# Patient Record
Sex: Male | Born: 2000 | Race: White | Hispanic: Yes | Marital: Single | Smoking: Current some day smoker
Health system: Southern US, Community
[De-identification: ages and names within clinical notes are randomized; demographics above are authoritative.]

## PROBLEM LIST (undated history)

## (undated) DIAGNOSIS — F419 Anxiety disorder, unspecified: Secondary | ICD-10-CM

## (undated) HISTORY — PX: TONSILLECTOMY: SUR1361

---
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
## 2021-06-23 IMAGING — CR DG WRIST COMPLETE 3+V*R*
4 series · 4 of 4 positions shown · non-contrast
Comparison: None.

CLINICAL DATA: Punched a wall, injury

EXAM:
RIGHT WRIST - COMPLETE 3+ VIEW

[wrist pa]
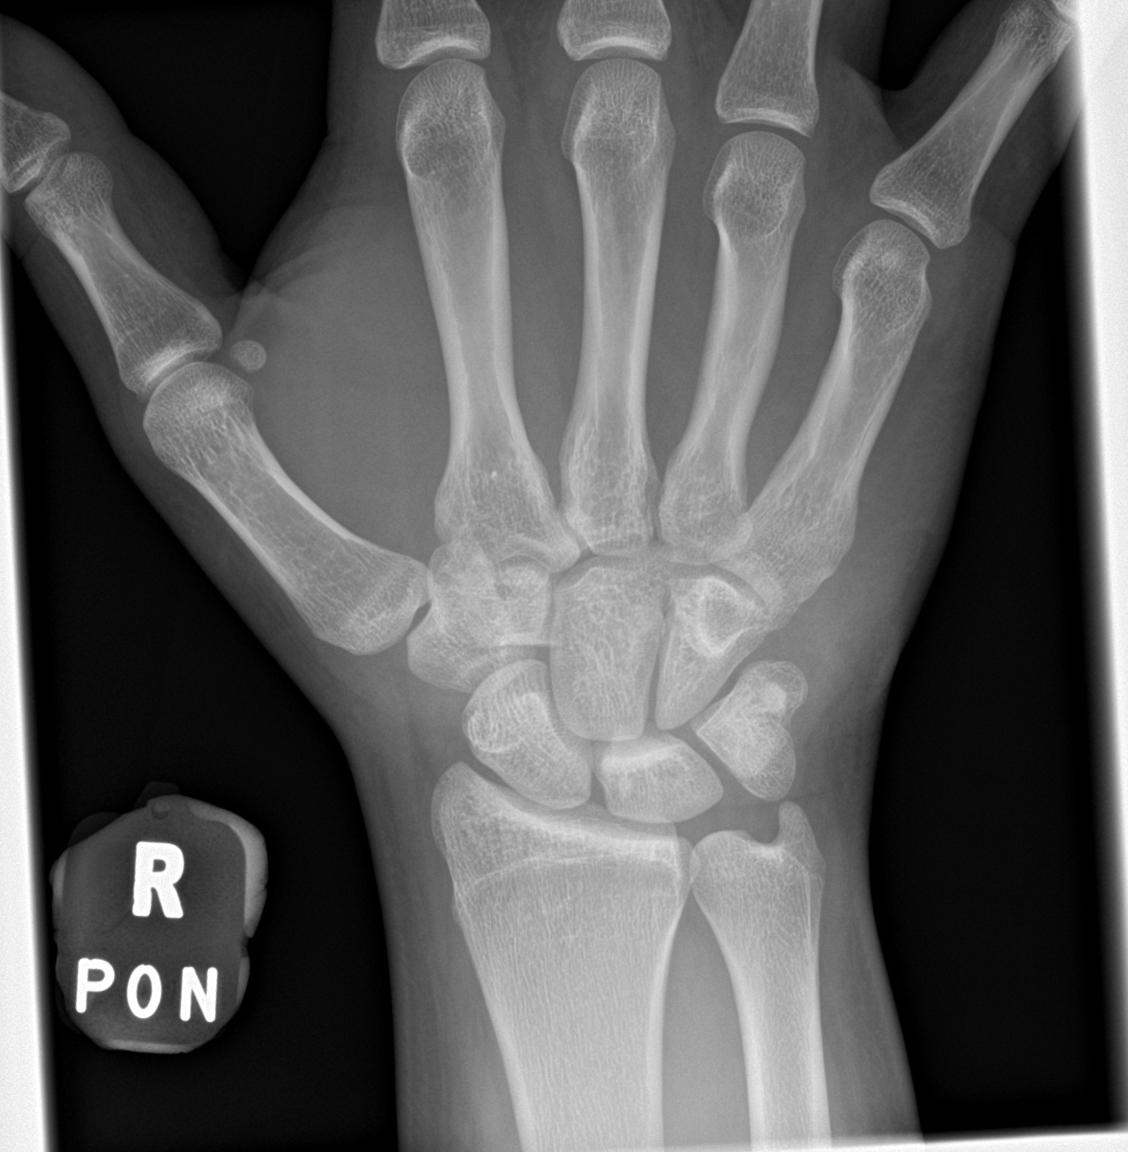

[wrist obl]
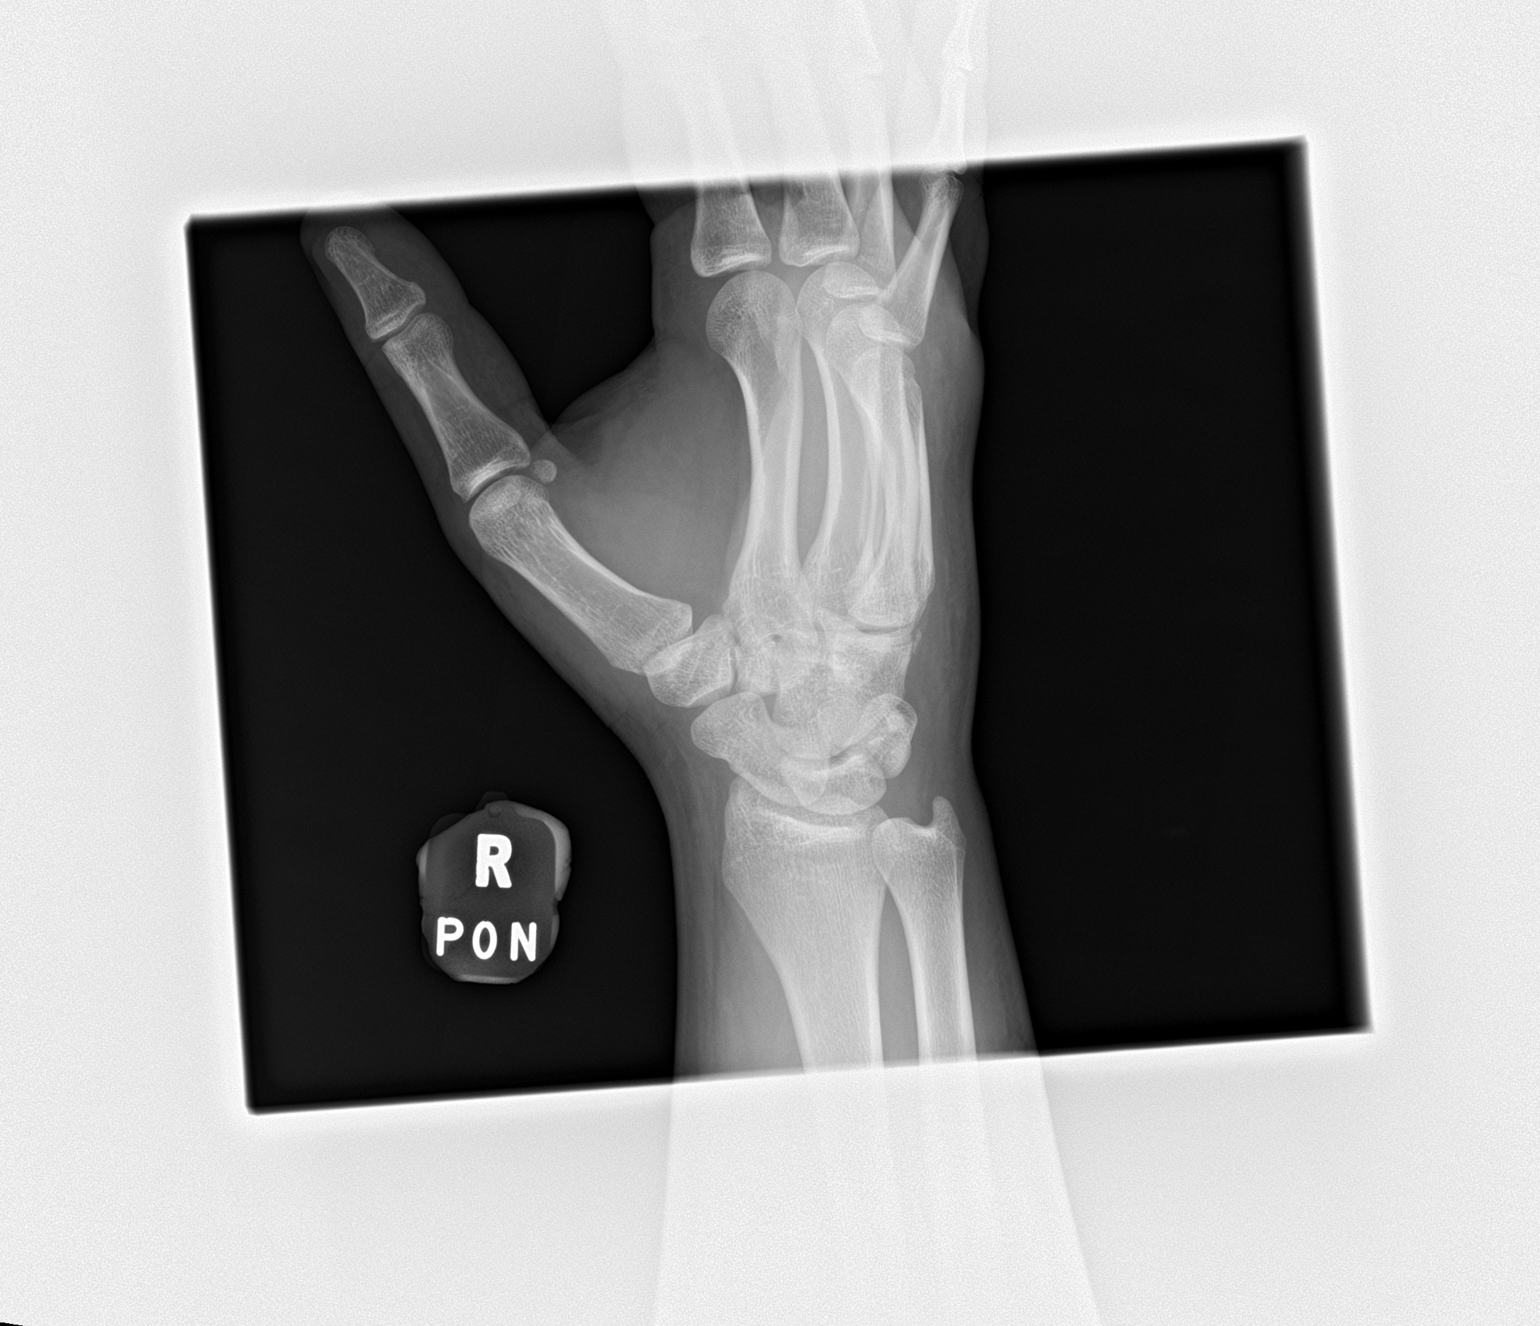

[wrist lat]
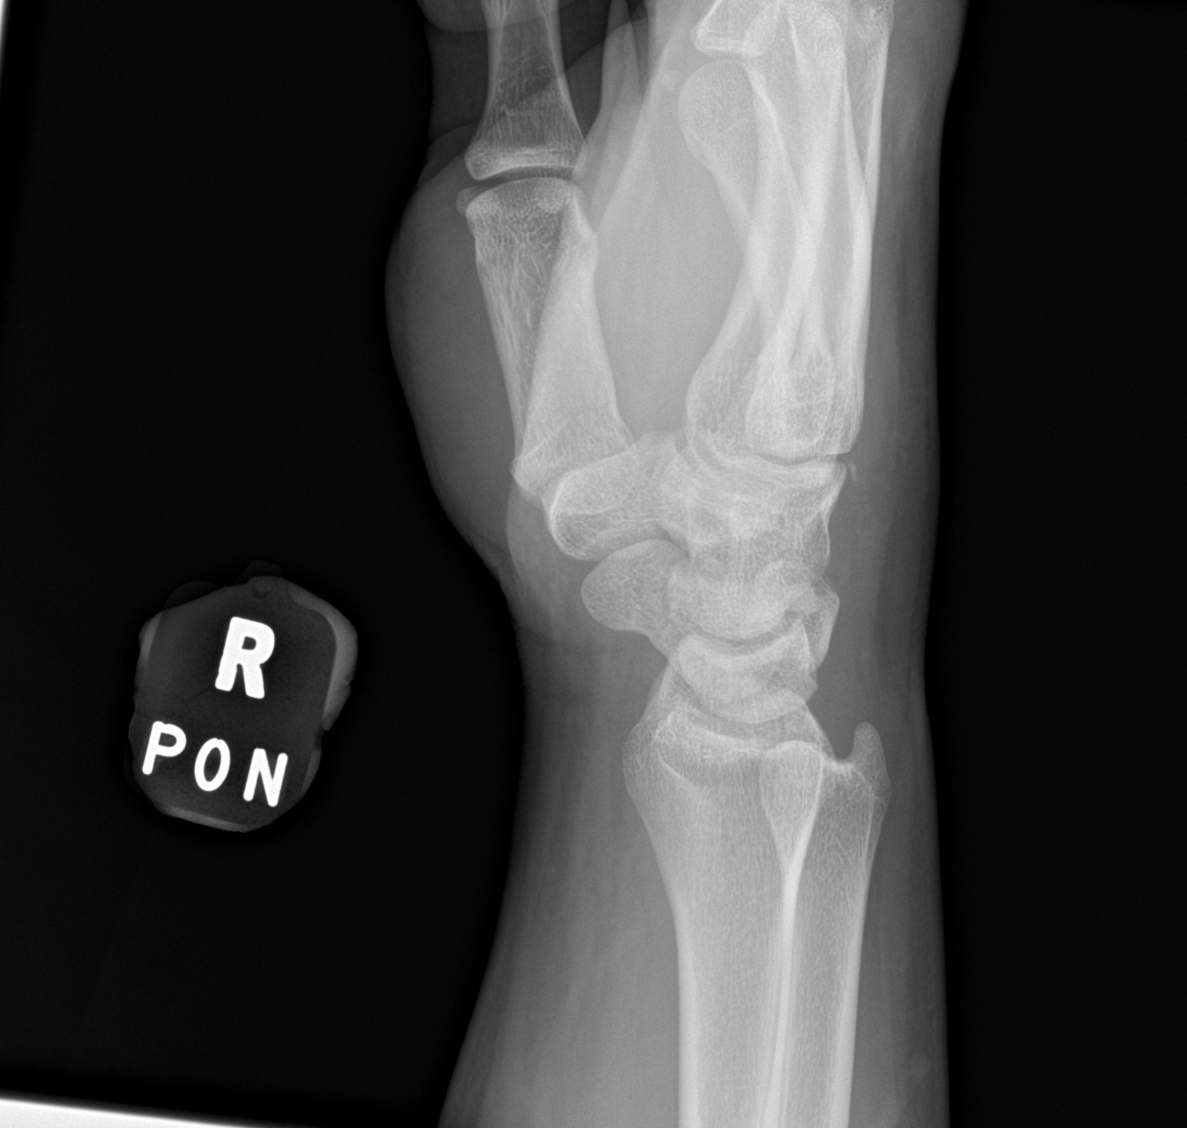

[wrist navicular]
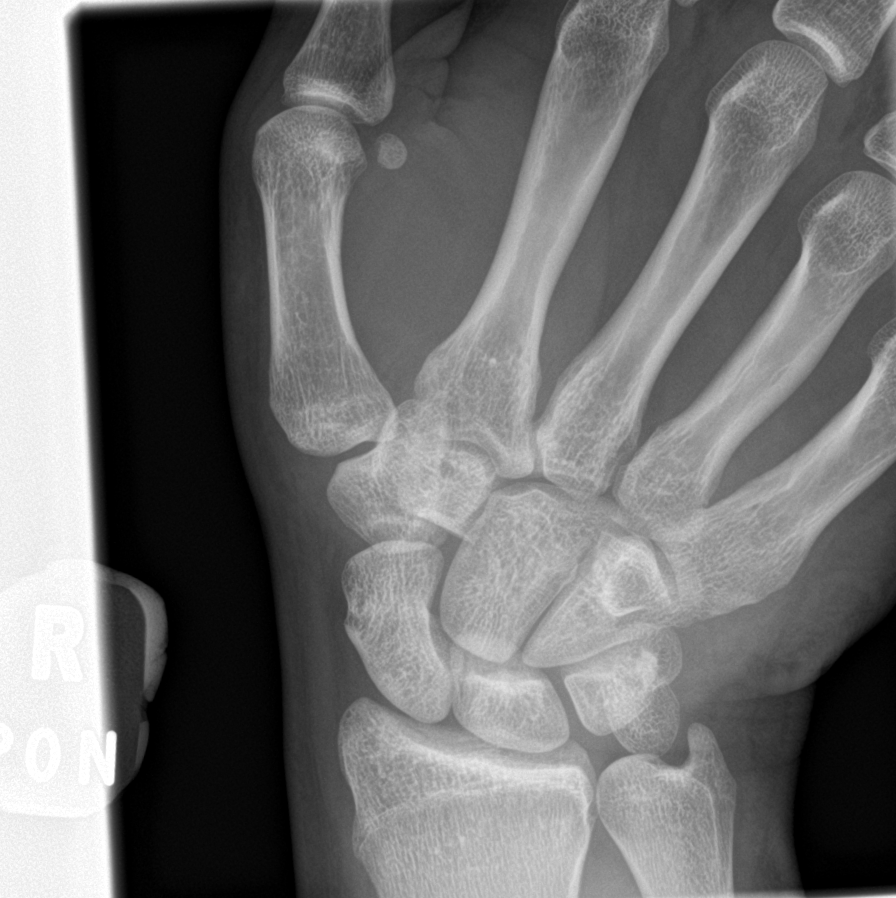

[4 of 4 positions shown; findings below may reference images not displayed]

FINDINGS: Tiny ossific fragment seen along the dorsal aspect of the distal
carpal row, likely from the hamate. No other displaced fracture
seen. There is a healed fifth metacarpal fracture. Dorsal soft
tissue swelling is seen.
IMPRESSION: Tiny chip fracture on the dorsal aspect of the hand, likely from the
hamate.
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
## 2022-07-26 DIAGNOSIS — R45851 Suicidal ideations: Secondary | ICD-10-CM | POA: Insufficient documentation

## 2022-07-26 DIAGNOSIS — Z79899 Other long term (current) drug therapy: Secondary | ICD-10-CM | POA: Insufficient documentation

## 2022-07-26 DIAGNOSIS — F17290 Nicotine dependence, other tobacco product, uncomplicated: Secondary | ICD-10-CM | POA: Insufficient documentation

## 2022-07-26 DIAGNOSIS — F339 Major depressive disorder, recurrent, unspecified: Secondary | ICD-10-CM | POA: Insufficient documentation

## 2022-07-26 DIAGNOSIS — Z91148 Patient's other noncompliance with medication regimen for other reason: Secondary | ICD-10-CM | POA: Insufficient documentation

## 2022-07-26 DIAGNOSIS — F17210 Nicotine dependence, cigarettes, uncomplicated: Secondary | ICD-10-CM | POA: Insufficient documentation

## 2022-07-26 DIAGNOSIS — Z1152 Encounter for screening for COVID-19: Secondary | ICD-10-CM | POA: Insufficient documentation

## 2022-07-26 NOTE — ED Triage Notes (Signed)
Pt presents to [facility] voluntarily, accompanied by his mother with complaint of depression, passive SI and overthinking. Pt stated ' I think too much" when asked what brought him to [facility]? Pt reports having dark thoughts and feeling that no one understands him. Pt reports an issue he had with a friend who he considered a support system. Pt reports that he shared his thoughts with her and he felt like she looks at him differently now and the relationship has changed. Pt states that he think about suicide often, but has never acted on his thoughts. Pt has history of depression and anxiety. Pt has past impatient psychiatric hospitalization at [facility]. Pt is not linked to outpatient services at this time nor is he taking prescribed medications. Pt currently denies HI, AVH.

## 2022-07-27 ENCOUNTER — Inpatient Hospital Stay (HOSPITAL_COMMUNITY)
Admission: AD | Admit: 2022-07-27 | Discharge: 2022-08-03 | DRG: 885 | Disposition: A | Discharge status: Home / Self Care | Payer: Medicaid Other | Source: Intra-hospital | Attending: Psychiatry | Admitting: Psychiatry

## 2022-07-27 ENCOUNTER — Encounter (HOSPITAL_COMMUNITY): Payer: Self-pay | Admitting: Psychiatry

## 2022-07-27 ENCOUNTER — Other Ambulatory Visit: Payer: Self-pay

## 2022-07-27 ENCOUNTER — Ambulatory Visit (HOSPITAL_COMMUNITY)
Admission: EM | Admit: 2022-07-27 | Discharge: 2022-07-27 | Disposition: A | Discharge status: Other Acute Inpatient Hospital | Payer: Medicaid Other | Attending: Psychiatry | Admitting: Psychiatry

## 2022-07-27 DIAGNOSIS — F17210 Nicotine dependence, cigarettes, uncomplicated: Secondary | ICD-10-CM | POA: Diagnosis present

## 2022-07-27 DIAGNOSIS — F411 Generalized anxiety disorder: Secondary | ICD-10-CM | POA: Diagnosis present

## 2022-07-27 DIAGNOSIS — F339 Major depressive disorder, recurrent, unspecified: Secondary | ICD-10-CM | POA: Diagnosis not present

## 2022-07-27 DIAGNOSIS — Z818 Family history of other mental and behavioral disorders: Secondary | ICD-10-CM | POA: Diagnosis not present

## 2022-07-27 DIAGNOSIS — R45851 Suicidal ideations: Secondary | ICD-10-CM

## 2022-07-27 DIAGNOSIS — Z79899 Other long term (current) drug therapy: Secondary | ICD-10-CM | POA: Diagnosis not present

## 2022-07-27 DIAGNOSIS — F819 Developmental disorder of scholastic skills, unspecified: Secondary | ICD-10-CM | POA: Diagnosis present

## 2022-07-27 DIAGNOSIS — F333 Major depressive disorder, recurrent, severe with psychotic symptoms: Principal | ICD-10-CM | POA: Diagnosis present

## 2022-07-27 DIAGNOSIS — F419 Anxiety disorder, unspecified: Secondary | ICD-10-CM | POA: Diagnosis present

## 2022-07-27 DIAGNOSIS — Z1152 Encounter for screening for COVID-19: Secondary | ICD-10-CM

## 2022-07-27 DIAGNOSIS — F259 Schizoaffective disorder, unspecified: Secondary | ICD-10-CM | POA: Diagnosis not present

## 2022-07-27 DIAGNOSIS — Z560 Unemployment, unspecified: Secondary | ICD-10-CM

## 2022-07-27 DIAGNOSIS — Z91199 Patient's noncompliance with other medical treatment and regimen due to unspecified reason: Secondary | ICD-10-CM

## 2022-07-27 DIAGNOSIS — Z91148 Patient's other noncompliance with medication regimen for other reason: Secondary | ICD-10-CM | POA: Diagnosis not present

## 2022-07-27 DIAGNOSIS — R682 Dry mouth, unspecified: Secondary | ICD-10-CM | POA: Diagnosis present

## 2022-07-27 DIAGNOSIS — F251 Schizoaffective disorder, depressive type: Principal | ICD-10-CM | POA: Diagnosis present

## 2022-07-27 DIAGNOSIS — F1910 Other psychoactive substance abuse, uncomplicated: Secondary | ICD-10-CM

## 2022-07-27 DIAGNOSIS — F1220 Cannabis dependence, uncomplicated: Secondary | ICD-10-CM | POA: Diagnosis present

## 2022-07-27 LAB — POCT URINE DRUG SCREEN - MANUAL ENTRY (I-SCREEN)
POC Amphetamine UR: NOT DETECTED
POC Buprenorphine (BUP): NOT DETECTED
POC Cocaine UR: NOT DETECTED
POC Marijuana UR: POSITIVE — AB
POC Methadone UR: NOT DETECTED
POC Methamphetamine UR: NOT DETECTED
POC Morphine: NOT DETECTED
POC Oxazepam (BZO): NOT DETECTED
POC Oxycodone UR: NOT DETECTED
POC Secobarbital (BAR): NOT DETECTED

## 2022-07-27 LAB — RESP PANEL BY RT-PCR (RSV, FLU A&B, COVID)  RVPGX2
Influenza A by PCR: NEGATIVE
Influenza B by PCR: NEGATIVE
Resp Syncytial Virus by PCR: NEGATIVE
SARS Coronavirus 2 by RT PCR: NEGATIVE

## 2022-07-27 LAB — CBC WITH DIFFERENTIAL/PLATELET
Abs Immature Granulocytes: 0.01 10*3/uL (ref 0.00–0.07)
Basophils Absolute: 0 10*3/uL (ref 0.0–0.1)
Basophils Relative: 1 %
Eosinophils Absolute: 0.1 10*3/uL (ref 0.0–0.5)
Eosinophils Relative: 2 %
HCT: 48.4 % (ref 39.0–52.0)
Hemoglobin: 16.6 g/dL (ref 13.0–17.0)
Immature Granulocytes: 0 %
Lymphocytes Relative: 17 %
Lymphs Abs: 1.1 10*3/uL (ref 0.7–4.0)
MCH: 31.6 pg (ref 26.0–34.0)
MCHC: 34.3 g/dL (ref 30.0–36.0)
MCV: 92.2 fL (ref 80.0–100.0)
Monocytes Absolute: 0.6 10*3/uL (ref 0.1–1.0)
Monocytes Relative: 9 %
Neutro Abs: 4.7 10*3/uL (ref 1.7–7.7)
Neutrophils Relative %: 71 %
Platelets: 201 10*3/uL (ref 150–400)
RBC: 5.25 MIL/uL (ref 4.22–5.81)
RDW: 12.7 % (ref 11.5–15.5)
WBC: 6.6 10*3/uL (ref 4.0–10.5)
nRBC: 0 % (ref 0.0–0.2)

## 2022-07-27 LAB — COMPREHENSIVE METABOLIC PANEL
ALT: 14 U/L (ref 0–44)
AST: 17 U/L (ref 15–41)
Albumin: 4.3 g/dL (ref 3.5–5.0)
Alkaline Phosphatase: 59 U/L (ref 38–126)
Anion gap: 10 (ref 5–15)
BUN: 7 mg/dL (ref 6–20)
CO2: 25 mmol/L (ref 22–32)
Calcium: 9.3 mg/dL (ref 8.9–10.3)
Chloride: 103 mmol/L (ref 98–111)
Creatinine, Ser: 0.94 mg/dL (ref 0.61–1.24)
GFR, Estimated: >60 mL/min (ref >60)
Glucose, Bld: 131 mg/dL — ABNORMAL HIGH (ref 70–99)
Potassium: 3 mmol/L — ABNORMAL LOW (ref 3.5–5.1)
Sodium: 138 mmol/L (ref 135–145)
Total Bilirubin: 0.5 mg/dL (ref 0.3–1.2)
Total Protein: 6.8 g/dL (ref 6.5–8.1)

## 2022-07-27 LAB — LIPID PANEL
Cholesterol: 155 mg/dL (ref 0–200)
HDL: 41 mg/dL (ref >40)
LDL Cholesterol: 96 mg/dL (ref 0–99)
Total CHOL/HDL Ratio: 3.8 RATIO
Triglycerides: 91 mg/dL (ref <150)
VLDL: 18 mg/dL (ref 0–40)

## 2022-07-27 LAB — TSH: TSH: 1.104 u[IU]/mL (ref 0.350–4.500)

## 2022-07-27 LAB — POC SARS CORONAVIRUS 2 AG: SARSCOV2ONAVIRUS 2 AG: NEGATIVE

## 2022-07-27 MED ORDER — DIPHENHYDRAMINE HCL 50 MG/ML IJ SOLN: 50.0000 mg | Freq: Three times a day (TID) | INTRAMUSCULAR | Status: DC | PRN

## 2022-07-27 MED ORDER — DIPHENHYDRAMINE HCL 25 MG PO CAPS: 50.0000 mg | ORAL_CAPSULE | Freq: Three times a day (TID) | ORAL | Status: DC | PRN

## 2022-07-27 MED ORDER — ACETAMINOPHEN 325 MG PO TABS
650.0000 mg | ORAL_TABLET | Freq: Four times a day (QID) | ORAL | Status: DC | PRN
  Administered 2022-07-30: 650 mg via ORAL
  Filled 2022-07-27: qty 2

## 2022-07-27 MED ORDER — OLANZAPINE 5 MG PO TBDP: 5.0000 mg | ORAL_TABLET | Freq: Three times a day (TID) | ORAL | Status: DC | PRN

## 2022-07-27 MED ORDER — MAGNESIUM HYDROXIDE 400 MG/5ML PO SUSP: 30.0000 mL | Freq: Every day | ORAL | Status: DC | PRN

## 2022-07-27 MED ORDER — ALUM & MAG HYDROXIDE-SIMETH 200-200-20 MG/5ML PO SUSP: 30.0000 mL | ORAL | 0 refills | Status: DC | PRN

## 2022-07-27 MED ORDER — ACETAMINOPHEN 325 MG PO TABS: 650.0000 mg | ORAL_TABLET | Freq: Four times a day (QID) | ORAL | Status: DC | PRN

## 2022-07-27 MED ORDER — ZIPRASIDONE MESYLATE 20 MG IM SOLR: 20.0000 mg | INTRAMUSCULAR | Status: DC | PRN

## 2022-07-27 MED ORDER — HALOPERIDOL LACTATE 5 MG/ML IJ SOLN: 5.0000 mg | Freq: Three times a day (TID) | INTRAMUSCULAR | Status: DC | PRN

## 2022-07-27 MED ORDER — MAGNESIUM HYDROXIDE 400 MG/5ML PO SUSP: 30.0000 mL | Freq: Every day | ORAL | 0 refills | Status: DC | PRN

## 2022-07-27 MED ORDER — LORAZEPAM 1 MG PO TABS: 1.0000 mg | ORAL_TABLET | ORAL | Status: DC | PRN

## 2022-07-27 MED ORDER — LORAZEPAM 2 MG/ML IJ SOLN: 2.0000 mg | Freq: Three times a day (TID) | INTRAMUSCULAR | Status: DC | PRN

## 2022-07-27 MED ORDER — TRAZODONE HCL 50 MG PO TABS
50.0000 mg | ORAL_TABLET | Freq: Every evening | ORAL | Status: DC | PRN
  Administered 2022-07-27: 50 mg via ORAL
  Filled 2022-07-27: qty 1

## 2022-07-27 MED ORDER — ESCITALOPRAM OXALATE 10 MG PO TABS
20.0000 mg | ORAL_TABLET | Freq: Every day | ORAL | Status: DC
  Administered 2022-07-27: 20 mg via ORAL
  Filled 2022-07-27: qty 2

## 2022-07-27 MED ORDER — ALUM & MAG HYDROXIDE-SIMETH 200-200-20 MG/5ML PO SUSP: 30.0000 mL | ORAL | Status: DC | PRN

## 2022-07-27 MED ORDER — NICOTINE 14 MG/24HR TD PT24
14.0000 mg | MEDICATED_PATCH | Freq: Every day | TRANSDERMAL | Status: DC
  Administered 2022-07-28 – 2022-07-30 (×3): 14 mg via TRANSDERMAL
  Filled 2022-07-27 (×7): qty 1

## 2022-07-27 MED ORDER — HALOPERIDOL 5 MG PO TABS: 5.0000 mg | ORAL_TABLET | Freq: Three times a day (TID) | ORAL | Status: DC | PRN

## 2022-07-27 MED ORDER — LORAZEPAM 1 MG PO TABS: 1.0000 mg | ORAL_TABLET | ORAL | 0 refills | Status: DC | PRN

## 2022-07-27 MED ORDER — LORAZEPAM 1 MG PO TABS: 2.0000 mg | ORAL_TABLET | Freq: Three times a day (TID) | ORAL | Status: DC | PRN

## 2022-07-27 MED ORDER — HYDROXYZINE HCL 25 MG PO TABS
25.0000 mg | ORAL_TABLET | Freq: Four times a day (QID) | ORAL | Status: DC | PRN
  Administered 2022-07-28 – 2022-07-30 (×2): 25 mg via ORAL
  Filled 2022-07-27 (×5): qty 1

## 2022-07-27 NOTE — ED Notes (Signed)
[facility]SPORT CALLED 

## 2022-07-27 NOTE — ED Notes (Signed)
REPORT GIVEN TO LIZ RN@[facility] ADULT UNIT  

## 2022-07-27 NOTE — BH Assessment (Signed)
Per Larose Kells, RN pt has been accepted to [facility] [facility] pending discharges.    Vertell Novak, [place], [facility], [facility] Triage Specialist [phone]
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
## 2022-07-27 NOTE — ED Notes (Signed)
Patient is awake and alert on unit.  Eating breakfast.  No distress.

## 2022-07-27 NOTE — Progress Notes (Signed)
   07/27/22 2229  Psych Admission Type (Psych Patients Only)  Admission Status Voluntary  Psychosocial Assessment  Patient Complaints Depression  Eye Contact Brief  Facial Expression Flat  Affect Appropriate to circumstance  Speech Logical/coherent  Interaction Assertive  Motor Activity Other (Comment) (WDL)  Appearance/Hygiene Unremarkable  Behavior Characteristics Guarded;Fidgety  Mood Pleasant  Thought Process  Coherency WDL  Content WDL  Delusions None reported or observed  Perception WDL  Hallucination None reported or observed  Judgment Impaired  Confusion None  Danger to Self  Current suicidal ideation? Denies  Self-Injurious Behavior No self-injurious ideation or behavior indicators observed or expressed   Agreement Not to Harm Self Yes  Description of Agreement verbal  Danger to Others  Danger to Others None reported or observed

## 2022-07-27 NOTE — ED Notes (Signed)
Pt sitting in middle of floor coloring and doing puzzles. Pt calm and cooperative. No c/o pain or distress. Will continue to monitor for safety
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
## 2022-07-27 NOTE — Progress Notes (Signed)
Pt was accepted to [facility] 07/27/2022. Bed assignment: 99-2  Pt meets inpatient criteria per Rosezetta Schlatter, MD  Attending Physician will be Nadir Winfred Leeds, MD  Report can be called to: - Adult unit: [phone]  Pt can arrive after 9:30 PM  Care Team Notified: [facility] Antionette Lieutenant Diego, RN, and Rosezetta Schlatter, MD  [place], [place]  07/27/2022 12:05 PM
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
## 2022-07-27 NOTE — Discharge Instructions (Signed)
Follow-up recommendations:  Activity:  Normal, as tolerated Diet:  Per PCP recommendation  Patient is instructed prior to discharge to: Take all medications as prescribed by his mental healthcare provider. Report any adverse effects and/or reactions from the medicines to his outpatient provider promptly. Patient has been instructed & cautioned: To not engage in alcohol and or illegal drug use while on prescription medicines.  In the event of worsening symptoms, patient is instructed to call the crisis hotline at 988, 911 and or go to the nearest ED for appropriate evaluation and treatment of symptoms. To follow-up with his primary care provider for your other medical issues, concerns and or health care needs.  

## 2022-07-27 NOTE — ED Provider Notes (Signed)
FBC/OBS ASAP Discharge Summary  Date and Time: 07/27/2022 10:22 AM  Name: Ryan Horne  MRN:  [id number]   Discharge Diagnoses:  Final diagnoses:  Substance abuse ([place])  Recurrent major depressive disorder, remission status unspecified ([place])  Suicidal ideation    Subjective: Ryan Horne is a 22y/o male with a history of suicidal ideation, unspecified learning disorder, self injury behaviors, depression who presented to [facility] voluntarily accompanied by his mother for a "mental crisis I guess," being a danger to himself.   Stay Summary: Patient was admitted to the OBS unit where he was resumed on home medication Lexapro; patient had been noncompliant with Abilify previously prescribed, so this was not restarted at the time. On AM reassessment, patient denied SI, HI, and AVH, but continued to report that he felt he was a danger to himself. He was not very forthcoming with information. Patient did not appear to respond to internal stimuli nor did he appear psychotic. Patient continues to be recommended for inpatient admission due to feeling a danger to himself, medication optimization, and safety planning.   Total Time spent with patient: 30 minutes  Past Psychiatric History:  Major depressive disorder, self injury behavior, suicidal ideation, learning disorder. Prior inpatient at [facility] in 2020.  Past Medical History: See chart   Family History: Unknown   Social History: Polysubstance use; also smokes 7 cigarettes per day and vapes nicotine and mariuana. Tobacco Cessation:  A prescription for an FDA-approved tobacco cessation medication provided at discharge  Current Medications:  Current Facility-Administered Medications  Medication Dose Route Frequency Provider Last Rate Last Admin   acetaminophen (TYLENOL) tablet 650 mg  650 mg Oral Q6H PRN Evette Georges, NP       alum & mag hydroxide-simeth (MAALOX/MYLANTA) 200-200-20 MG/5ML suspension 30 mL  30 mL Oral Q4H PRN Evette Georges, NP        escitalopram (LEXAPRO) tablet 20 mg  20 mg Oral Daily Evette Georges, NP   20 mg at 07/27/22 0925   OLANZapine zydis (ZYPREXA) disintegrating tablet 5 mg  5 mg Oral Q8H PRN Evette Georges, NP       And   LORazepam (ATIVAN) tablet 1 mg  1 mg Oral PRN Evette Georges, NP       And   ziprasidone (GEODON) injection 20 mg  20 mg Intramuscular PRN Evette Georges, NP       magnesium hydroxide (MILK OF MAGNESIA) suspension 30 mL  30 mL Oral Daily PRN Evette Georges, NP       Current Outpatient Medications  Medication Sig Dispense Refill   escitalopram (LEXAPRO) 20 MG tablet Take 1 tablet (20 mg total) by mouth daily. (Patient not taking: Reported on 07/27/2022) 30 tablet 1    PTA Medications:  Facility Ordered Medications  Medication   acetaminophen (TYLENOL) tablet 650 mg   alum & mag hydroxide-simeth (MAALOX/MYLANTA) 200-200-20 MG/5ML suspension 30 mL   magnesium hydroxide (MILK OF MAGNESIA) suspension 30 mL   OLANZapine zydis (ZYPREXA) disintegrating tablet 5 mg   And   LORazepam (ATIVAN) tablet 1 mg   And   ziprasidone (GEODON) injection 20 mg   escitalopram (LEXAPRO) tablet 20 mg   PTA Medications  Medication Sig   escitalopram (LEXAPRO) 20 MG tablet Take 1 tablet (20 mg total) by mouth daily. (Patient not taking: Reported on 07/27/2022)       02/23/2020    3:34 PM  Depression screen PHQ 2/9  Decreased Interest 3  Down, Depressed, Hopeless 3  PHQ - 2  Score 6  Altered sleeping 3  Tired, decreased energy 1  Change in appetite 2  Feeling bad or failure about yourself  3  Trouble concentrating 2  Moving slowly or fidgety/restless 2  Suicidal thoughts 0  PHQ-9 Score 19  Difficult doing work/chores Somewhat difficult    Flowsheet Row ED from 07/27/2022 in [facility] Admission (Discharged) from 11/22/2018 in [place] ED from 11/21/2018 in [facility] [place] No Risk  Moderate Risk Low Risk       Musculoskeletal  Strength & Muscle Tone: within normal limits Gait & Station: normal Patient leans: N/A  Psychiatric Specialty Exam  Presentation  General Appearance:  Casual  Eye Contact: Good  Speech: Clear and Coherent  Speech Volume: Normal  Handedness: Right   Mood and Affect  Mood: Anxious; Depressed; Hopeless; Worthless; Labile  Affect: Engineer, building services Processes: Linear  Descriptions of Associations:Intact  Orientation:Full (Time, Place and Person)  Thought Content:WDL  Diagnosis of Schizophrenia or Schizoaffective disorder in past: No  Duration of Psychotic Symptoms: Greater than six months   Hallucinations:Hallucinations: None  Ideas of Reference:None  Suicidal Thoughts:Denies  Homicidal Thoughts:Homicidal Thoughts: No   Sensorium  Memory: Immediate Fair  Judgment: Poor  Insight: Fair; shallow   Executive Functions  Concentration: Fair  Attention Span: Good  Recall: Good  Fund of Knowledge: Good  Language: Good   Psychomotor Activity  Psychomotor Activity: Psychomotor Activity: Normal   Assets  Assets: Desire for Improvement; Resilience; Social Support; Vocational/Educational   Sleep  Sleep: Sleep: Poor Number of Hours of Sleep: 3   Nutritional Assessment (For OBS and FBC admissions only) Has the patient had a weight loss or gain of 10 pounds or more in the last 3 months?: No Has the patient had a decrease in food intake/or appetite?: No Does the patient have dental problems?: No Does the patient have eating habits or behaviors that may be indicators of an eating disorder including binging or inducing vomiting?: No Has the patient recently lost weight without trying?: 0 Has the patient been eating poorly because of a decreased appetite?: 0 Malnutrition Screening Tool Score: 0    Physical Exam  Physical Exam Vitals reviewed.  HENT:     Head:  Normocephalic and atraumatic.  Pulmonary:     Effort: Pulmonary effort is normal.  Neurological:     General: No focal deficit present.     Mental Status: He is alert and oriented to person, place, and time.     Motor: No weakness.     Gait: Gait normal.   Review of Systems  Constitutional:  Negative for malaise/fatigue.  Neurological:  Negative for headaches.  Psychiatric/Behavioral:  Negative for hallucinations and suicidal ideas.    Blood pressure (!) 137/98, pulse 97, temperature 98 F (36.7 C), temperature source Oral, resp. rate 18, SpO2 99 %. There is no height or weight on file to calculate BMI.  Demographic Factors:  Male and Adolescent or young adult  Loss Factors: NA  Historical Factors: Prior suicide attempts and Impulsivity  Risk Reduction Factors:   Sense of responsibility to family, Living with another person, especially a relative, and Positive social support  Continued Clinical Symptoms:  Depression:   Impulsivity Alcohol/Substance Abuse/Dependencies Unstable or Poor Therapeutic Relationship Previous Psychiatric Diagnoses and Treatments  Cognitive Features That Contribute To Risk:  None    Suicide Risk:  Severe:  Frequent,  intense, and enduring suicidal ideation, specific plan, no subjective intent, but some objective markers of intent (i.e., choice of lethal method), the method is accessible, some limited preparatory behavior, evidence of impaired self-control, severe dysphoria/symptomatology, multiple risk factors present, and few if any protective factors, particularly a lack of social support.  Plan Of Care/Follow-up recommendations:  Follow-up recommendations:  Activity:  Normal, as tolerated Diet:  Per PCP recommendation  Patient is instructed prior to discharge to: Take all medications as prescribed by his mental healthcare provider. Report any adverse effects and/or reactions from the medicines to his outpatient provider promptly. Patient has  been instructed & cautioned: To not engage in alcohol and or illegal drug use while on prescription medicines.  In the event of worsening symptoms, patient is instructed to call the crisis hotline at 988, 911 and or go to the nearest ED for appropriate evaluation and treatment of symptoms. To follow-up with his primary care provider for your other medical issues, concerns and or health care needs.   Disposition: [facility], 402-2  Rosezetta Schlatter, MD 07/27/2022, 10:22 AM

## 2022-07-27 NOTE — ED Notes (Signed)
Pt admitted to OBS endorsing thinking a lot these days and that makes him freak out. Pt was accompanied by mother. Patient denies SI currently, Patient was cooperative during the admission assessment. Skin assessment complete. Belongings inventoried. Patient oriented to unit and unit rules. Meal and drinks offered to patient.  Patient verbalized agreement to treatment plans. Patient verbally contracts for safety while hospitalized. Will monitor for safety.

## 2022-07-28 DIAGNOSIS — F1220 Cannabis dependence, uncomplicated: Secondary | ICD-10-CM | POA: Diagnosis present

## 2022-07-28 DIAGNOSIS — R45851 Suicidal ideations: Secondary | ICD-10-CM

## 2022-07-28 LAB — HEMOGLOBIN A1C
Hgb A1c MFr Bld: 5.1 % (ref 4.8–5.6)
Mean Plasma Glucose: 100 mg/dL

## 2022-07-28 MED ORDER — ARIPIPRAZOLE 5 MG PO TABS
5.0000 mg | ORAL_TABLET | Freq: Every day | ORAL | Status: DC
  Administered 2022-07-28 – 2022-07-30 (×3): 5 mg via ORAL
  Filled 2022-07-28 (×3): qty 1

## 2022-07-28 MED ORDER — POTASSIUM CHLORIDE CRYS ER 20 MEQ PO TBCR
40.0000 meq | EXTENDED_RELEASE_TABLET | Freq: Two times a day (BID) | ORAL | Status: AC
  Administered 2022-07-28 (×2): 40 meq via ORAL
  Filled 2022-07-28 (×4): qty 2

## 2022-07-28 MED ORDER — TRAZODONE HCL 50 MG PO TABS
50.0000 mg | ORAL_TABLET | Freq: Every day | ORAL | Status: DC
  Administered 2022-07-28: 50 mg via ORAL
  Filled 2022-07-28 (×4): qty 1

## 2022-07-28 NOTE — Progress Notes (Signed)
   07/28/22 2132  Psych Admission Type (Psych Patients Only)  Admission Status Voluntary  Psychosocial Assessment  Patient Complaints Depression  Eye Contact Fair  Facial Expression Flat  Affect Appropriate to circumstance  Speech Logical/coherent  Interaction Minimal  Motor Activity Other (Comment) (wnl)  Appearance/Hygiene Unremarkable  Behavior Characteristics Cooperative;Appropriate to situation  Mood Pleasant  Thought Process  Coherency WDL  Content WDL  Delusions None reported or observed  Perception WDL  Hallucination None reported or observed  Judgment Poor  Confusion None  Danger to Self  Current suicidal ideation? Denies  Danger to Others  Danger to Others None reported or observed

## 2022-07-28 NOTE — Group Note (Signed)
[place] LCSW Group Therapy Note   Group Date: 07/28/2022 Start Time: 1300 End Time: 1400  Type of Therapy and Topic:  Group Therapy:  Feelings around Relapse and Recovery  Participation Level:  Did Not Attend   Mood:  Description of Group:    Patients in this group will discuss emotions they experience before and after a relapse. They will process how experiencing these feelings, or avoidance of experiencing them, relates to having a relapse. Facilitator will guide patients to explore emotions they have related to recovery. Patients will be encouraged to process which emotions are more powerful. They will be guided to discuss the emotional reaction significant others in their lives may have to patients' relapse or recovery. Patients will be assisted in exploring ways to respond to the emotions of others without this contributing to a relapse.  Therapeutic Goals: Patient will identify two or more emotions that lead to relapse for them:  Patient will identify two emotions that result when they relapse:  Patient will identify two emotions related to recovery:  Patient will demonstrate ability to communicate their needs through discussion and/or role plays.   Summary of Patient Progress: Patient did not attend group despite encouraged participation.     Therapeutic Modalities:   Cognitive Behavioral Therapy Solution-Focused Therapy Assertiveness Training Relapse Prevention Therapy   Durenda Hurt, [place]

## 2022-07-28 NOTE — Progress Notes (Addendum)
D. Pt presented anxious, somewhat religiously preoccupied and a little disorganized upon initial approach this am. Throughout the shift, pt has been calm and cooperative, visible in the milieu, sitting in the dayroom writing with minimal interactions with peers. Per pt's self inventory, pt rated his depression,hopelessness and anxiety a 6/5/6, respectively.   Pt currently denies SI/HI and AVH and agrees to contact staff before acting on any harmful thoughts. Pt denied paranoia, but did appear distressed when discussing his new religious beliefs, stating, "there's a religious warfare going on."  A. Labs and vitals monitored. Pt given and educated on medications. Pt supported emotionally and encouraged to express concerns and ask questions.   R. Pt remains safe with 15 minute checks. Will continue POC.    07/28/22 1100  Psych Admission Type (Psych Patients Only)  Admission Status Voluntary  Psychosocial Assessment  Patient Complaints Anxiety;Depression  Eye Contact Fair  Facial Expression Anxious  Affect Appropriate to circumstance  Speech Logical/coherent  Interaction Assertive  Motor Activity Other (Comment) (level 3 observation)  Appearance/Hygiene Unremarkable  Behavior Characteristics Cooperative;Anxious  Mood Pleasant  Thought Process  Coherency Tangential;WDL  Content Religiosity  Delusions Religious  Perception WDL  Hallucination None reported or observed  Judgment Impaired  Confusion None  Danger to Self  Current suicidal ideation? Denies  Self-Injurious Behavior No self-injurious ideation or behavior indicators observed or expressed   Agreement Not to Harm Self Yes  Description of Agreement verbal contract  Danger to Others  Danger to Others None reported or observed
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
## 2022-07-28 NOTE — H&P (Cosign Needed Addendum)
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 BMP in the morning.  CBC reviewed.UDS positive for THC  Labs ordered: BMP, UA, baseline EKG ordered.    PLAN Safety and Monitoring: Voluntary admission to inpatient psychiatric unit for safety, stabilization and treatment Daily contact with patient to assess and evaluate symptoms and progress in treatment Patient's case to be discussed in multi-disciplinary team meeting Observation Level : q15 minute checks Vital signs: q12  hours Precautions: Safety  Long Term Goal(s): Improvement in symptoms so as ready for discharge  Short Term Goals: Ability to identify changes in lifestyle to reduce recurrence of condition will improve, Ability to verbalize feelings will improve, Ability to disclose and discuss suicidal ideas, Ability to identify and develop effective coping behaviors will improve, Ability to maintain clinical measurements within normal limits will improve, Compliance with prescribed medications will improve, and Ability to identify triggers associated with substance abuse/mental health issues will improve  Diagnoses Active Problems:   MDD (major depressive disorder), recurrent, severe, with psychosis (HCC)   Anxiety   Delta-9-tetrahydrocannabinol (THC) dependence (HCC)  Medications  -Start Abilify 5 mg daily for mood stabilization and psychosis -We will consider adding Prozac tomorrow 3/10 after talking to mother to determine if patient has periods  of mania -Change trazodone to 50 mg nightly for sleep -Continue nicotine patch daily for nicotine addiction -Continue hydroxyzine 25 mg 3 times daily as needed for anxiety -Continue agitation protocol: Ativan/Haldol/Benadryl-please see MAR for complete order  ANTIPSYCHOTIC CONSENT : We discussed the risks, benefits, side effects, and alternatives to THE PRESCRIBED ANTIPSYCHOTIC,  including but not limited to, the risk of fatigue, sedation, metabolic syndrome, weight gain, movement abnormalities such as tremor & cogwheeling & tardive dyskinesia, temperature sensitivity, photosensitivity, blood pressure changes, heart rhythm effects, potential for medication interactions, and to not take these medications with alcohol or illicit drugs; informed consent was obtained. We discussed the necessity for routine monitoring including rating scales of abnormal movements, blood work, and ekgs, while the patient is prescribed antipsychotic medication.   Other PRNS -Continue  Tylenol 650 mg every 6 hours PRN for mild pain -Continue Maalox 30 mg every 4 hrs PRN for indigestion -Continue Imodium 2-4 mg as needed for diarrhea -Continue Milk of Magnesia as needed every 6 hrs for constipation -Continue Zofran disintegrating tabs every 6 hrs PRN for nausea  Discharge Planning: Social work and case management to assist with discharge planning and identification of hospital follow-up needs prior to discharge Estimated LOS: 5-7 days Discharge Concerns: Need to establish a safety plan; Medication compliance and effectiveness Discharge Goals: Return home with outpatient referrals for mental health follow-up including medication management/psychotherapy  I certify that inpatient services furnished can reasonably be expected to improve the patient's condition.    Nicholes Rough, [place] 3/9/20246:24 PM

## 2022-07-28 NOTE — [facility] Suicide Risk Assessment (Signed)
[facility] INPATIENT:  Family/Significant Other Suicide Prevention Education  Suicide Prevention Education:  Education Completed; Ted Bargeron, mother, [phone]  (name of family member/significant other) has been identified by the patient as the family member/significant other with whom the patient will be residing, and identified as the person(s) who will aid the patient in the event of a mental health crisis (suicidal ideations/suicide attempt).  With written consent from the patient, the family member/significant other has been provided the following suicide prevention education, prior to the and/or following the discharge of the patient.  The suicide prevention education provided includes the following: Suicide risk factors Suicide prevention and interventions National Suicide Hotline telephone number [facility] assessment telephone number [facility] Emergency Assistance [place] and/or Residential Mobile Crisis Unit telephone number  Request made of family/significant other to: Remove weapons (e.g., guns, rifles, knives), all items previously/currently identified as safety concern.   Remove drugs/medications (over-the-counter, prescriptions, illicit drugs), all items previously/currently identified as a safety concern.  The family member/significant other verbalizes understanding of the suicide prevention education information provided.  The family member/significant other agrees to remove the items of safety concern listed above.  Durenda Hurt 07/28/2022, 3:56 PM
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
## 2022-07-28 NOTE — [facility] Group Notes (Signed)
[facility] Group Notes:  (Nursing/MHT/Case Management/Adjunct)  Date:  07/28/2022  Time:  8:47 PM  Type of Therapy:  Group Therapy  Participation Level:  Minimal  Participation Quality:  Appropriate  Affect:  Appropriate  Cognitive:  Appropriate  Insight:  Appropriate  Engagement in Group:  Developing/Improving  Modes of Intervention:  Education  Summary of Progress/Problems: Pt didn't have a goal, day was a 6/10. Participated in anger management exercise.  Orvan Falconer 07/28/2022, 8:47 PM

## 2022-07-29 DIAGNOSIS — F259 Schizoaffective disorder, unspecified: Principal | ICD-10-CM | POA: Diagnosis present

## 2022-07-29 DIAGNOSIS — F251 Schizoaffective disorder, depressive type: Principal | ICD-10-CM | POA: Diagnosis present

## 2022-07-29 LAB — BASIC METABOLIC PANEL
Anion gap: 11 (ref 5–15)
BUN: 10 mg/dL (ref 6–20)
CO2: 26 mmol/L (ref 22–32)
Calcium: 9.2 mg/dL (ref 8.9–10.3)
Chloride: 103 mmol/L (ref 98–111)
Creatinine, Ser: 0.88 mg/dL (ref 0.61–1.24)
GFR, Estimated: >60 mL/min (ref >60)
Glucose, Bld: 103 mg/dL — ABNORMAL HIGH (ref 70–99)
Potassium: 3.2 mmol/L — ABNORMAL LOW (ref 3.5–5.1)
Sodium: 140 mmol/L (ref 135–145)

## 2022-07-29 LAB — URINALYSIS, ROUTINE W REFLEX MICROSCOPIC
Bilirubin Urine: NEGATIVE
Glucose, UA: NEGATIVE mg/dL
Hgb urine dipstick: NEGATIVE
Ketones, ur: NEGATIVE mg/dL
Leukocytes,Ua: NEGATIVE
Nitrite: NEGATIVE
Protein, ur: NEGATIVE mg/dL
Specific Gravity, Urine: 1.014 (ref 1.005–1.030)
pH: 7 (ref 5.0–8.0)

## 2022-07-29 MED ORDER — FLUOXETINE HCL 20 MG PO CAPS
20.0000 mg | ORAL_CAPSULE | Freq: Every day | ORAL | Status: DC
  Administered 2022-07-30 – 2022-08-03 (×5): 20 mg via ORAL
  Filled 2022-07-29 (×6): qty 1

## 2022-07-29 MED ORDER — POTASSIUM CHLORIDE CRYS ER 20 MEQ PO TBCR
40.0000 meq | EXTENDED_RELEASE_TABLET | Freq: Two times a day (BID) | ORAL | Status: AC
  Administered 2022-07-29 (×2): 40 meq via ORAL
  Filled 2022-07-29 (×3): qty 2

## 2022-07-29 MED ORDER — TRAZODONE HCL 100 MG PO TABS
100.0000 mg | ORAL_TABLET | Freq: Every day | ORAL | Status: DC
  Administered 2022-07-29 – 2022-08-02 (×5): 100 mg via ORAL
  Filled 2022-07-29 (×8): qty 1

## 2022-07-29 NOTE — Progress Notes (Signed)
   07/29/22 0557  15 Minute Checks  Location Bedroom  Visual Appearance Calm  Behavior Sleeping  Sleep (Behavioral Health Patients Only)  Calculate sleep? (Click Yes once per 24 hr at 0600 safety check) Yes  Documented sleep last 24 hours 6

## 2022-07-29 NOTE — Progress Notes (Cosign Needed Addendum)
[facility] MD Progress Note  07/29/2022 4:56 PM Ryan Horne  MRN:  [id number] Principal Problem: Schizoaffective disorder, depressive type ([place]) Diagnosis: Principal Problem:   Schizoaffective disorder, depressive type ([place]) Active Problems:   Anxiety   Delta-9-tetrahydrocannabinol (THC) dependence ([place])  Reason for Admission: Ryan Horne is a 22 yo male with prior mental health diagnoses of MDD who presented to the [place] [facility] urgent care [facility]) on 03/08 accompanied by his mother with complaints of over thinking & SI "with a plan of anything quick"  as per [facility] documentation. Pt also reported VH of his emotions.  Patient was transferred voluntarily to this behavioral health Hospital for treatment and stabilization of his mental status.   24 hr chart review: Vital signs within normal limits for the past 24 hours, with the exception of a slightly elevated heart rate at 115 earlier today morning.  Patient is compliant with all scheduled medications, hydroxyzine 25 mg last given yesterday afternoon. No PRN medications given overnight. Pt is visible in the dayroom participating in unit group sessions, but has limited interactions with peers. Sleep hrs last night documented as 6 hrs as per nursing notes.   Patient assessment note, 07/29/2022: Mood today remains depressed, patient denies SI, denies HI, denies AVH today, continues to present with delusional thoughts and is religiously preoccupied.  He also presents with thought insertion, states that everyone can put thoughts into his brain, but it takes a while for him to process the thoughts, rendering it difficult for people to withdraw the thoughts, because he is an "over thinker".   Patient remains circumstantial today and perseverates about religion; he states that he gets special messages about religion which is intended to transfer him from music from "Sunray" and "Rogers Blocker are his favorite artists. He talks about the various  religious opinions being stressful to him, and states that some people think that there is paradise on earth, while other people think that earth is a "testing" for paradise. He states that the opinions are stressful to him because he "can tell how good or bad people think".   He presents with illogical thought process and his flow of ideas are incoherent and, disorganized as he explains why peoples' perceptions and views on religions are stressful to him. He states that people hold "memories of other people", and religion is supposed to help them move forward.  Patient reports that he is tolerating his medications well, denies medication related side effects, reports a fair sleep quality last night, states that he had trouble falling asleep, but once asleep he stays asleep.  He reports a good appetite, denies any trouble with his bowel movements, reports that he had a BM today.  He denies being in any physical distress today.  We will add Prozac 20 mg to patient's medication regimen for management of depressive symptoms, and continue other medications as listed below.  Collateral information from patient's mother: Patient's verbal consent was obtained, and patient's mother Ryan [phone])-was called (number obtained from patient).  Patient's mother answered phone, Probation officer introduced self as patient NP for today, stated reason for the call, mother voluntarily provided information as below: Patient's mother states that patient has always been introverted, has never had a lot of friends, and he knew some people with whom he attended school, and when there was a party a month ago, he decided to attend.  While at the Party, he used acid (LSD) for the very first time, and that led to a lot  of restlessness, worsening of his paranoia, an increase in his anxiety, decrease in focus, and an inability to complete simple tasks such as emptying the thrash or feeding the animals. Pt's mother also states that he  became more irritable, had trouble sleeping, would pace for most of the night, and started telling her things such as the government was trying to kill everyone, and would say things such as people would come into their home and kill everyone. She reports that she also noticed that pt was disinterested in his game which he typically loves to play, "had a short fuse", got angry fast, had racing thoughts, and told mother to take him to the hospital. Pt's mother states that an appointment, was given and was about 2 weeks ago, and pt stated that he could not wait that long because he felt as though if he waited that long, someone would kill him, or he would end up killing himself, which led to his mother taking him to the [facility].    Pt's mother states that patient started having AVH at approximately 22 years old; patient's mother states that at that time, he stated that he did not want to go to school, and did not want to go to the store, and verbalized feeling like people were judging him, and did not want to leave the home, and was being bullied at the middle school at the time. Pt's mother states that between the ages of 43 and 64 yrs old, pt was also cutting himself, and told her about the AVH; the AH was a voice calling his name, and the VH was one man with greasy long hair with skeletal face and body, shadow type figures, other skeletal figures, a little girl-Pt's mother states that the pt was still having the above AVH prior to this admission.  Pt's mother reports that in addition to the AVH, the paranoia and other symptoms above, the patient, was not paying attention to personal hygiene and grooming, was not bathing, not cleaning his room, not eating, and lost a lot of weight in the last month. She reports that he had worsening insomnia, poor concentration, poor motivation,crying spells, religious preoccupations and isolated himself, had lots of anger outbursts leading up to presentation at the [facility]. She  denies that energy level was ever extremely elevated prior to pt using the acid a month ago. She denies manic type symptoms in pt prior to him using thel acid.   Pt's mother reports that pt told her that he was using as much THC as he could, and she is not aware of any other substances that he was using. She reports that pt has not been on medications for several years since being discharged from [facility] c/a unit because he missed 3 appointments and was kicked out by his outpatient psychiatrist and they could not reschedule. She shares that when pt was on Abilify '10mg'$  nightly, Lexapro '40mg'$  nightly, Trazodone 150 mg nightly, this combination of medications worked well, and pt stated that his voices were decreasing. Mother verbalizes interest in the [facility] for management of his symptoms. And has been educated that this will be considered once pt's psychosis is resolving.   Patient's mother shares that patient's maternal uncles x2 and one aunt have " drug-induced schizophrenia".  She shares that her 62 year old sister is addicted to heroine and methamphetamines, and has been diagnosed with substance-induced schizophrenia.  She also shares that she has a 41 year old brother who also abuses heroine and methamphetamines and  who has also been diagnosed with substance-induced schizophrenia.  Patient's mother also shares that she has a 52 year old brother who is addicted to [facility] who has also been diagnosed with schizophrenia.  Patient's mother states that she has 6 children including patient, and patient is the oldest.  She reports that all 5 of the patient's siblings have not shown any signs of psychosis.  Patient's mother states that she is diagnosed with GAD and MDD.  Writer times patient's mother for the information, and added that patient's treatment team will be reaching out should more information essential in patient's treatment be required.  Mother thanked Probation officer, and call ended.  Past Psychiatric  History: MDD  Past Medical History:  Past Medical History:  Diagnosis Date   Anxiety     Past Surgical History:  Procedure Laterality Date   ADENOIDECTOMY  2009   TONSILLECTOMY     Family History:  Family History  Problem Relation Age of Onset   Anxiety disorder Mother    Depression Mother    Family Psychiatric  History: MDD Social History:  Social History   Substance and Sexual Activity  Alcohol Use Not Currently     Social History   Substance and Sexual Activity  Drug Use Yes   Types: Marijuana    Social History   Socioeconomic History   Marital status: Single    Spouse name: Not on file   Number of children: Not on file   Years of education: 11   Highest education level: 11th grade  Occupational History   Not on file  Tobacco Use   Smoking status: Some Days    Types: Cigarettes   Smokeless tobacco: Never  Vaping Use   Vaping Use: Never used  Substance and Sexual Activity   Alcohol use: Not Currently   Drug use: Yes    Types: Marijuana   Sexual activity: Not Currently  Other Topics Concern   Not on file  Social History Narrative   Not on file   Social Determinants of Health   Financial Resource Strain: Not on file  Food Insecurity: No Food Insecurity (07/27/2022)   Hunger Vital Sign    Worried About Running Out of Food in the Last Year: Never true    Ran Out of Food in the Last Year: Never true  Transportation Needs: No Transportation Needs (07/27/2022)   PRAPARE - Hydrologist (Medical): No    Lack of Transportation (Non-Medical): No  Physical Activity: Not on file  Stress: Not on file  Social Connections: Not on file   Sleep: Fair  Appetite:  Good  Current Medications: Current Facility-Administered Medications  Medication Dose Route Frequency Provider Last Rate Last Admin   acetaminophen (TYLENOL) tablet 650 mg  650 mg Oral Q6H PRN Evette Georges, NP       alum & mag hydroxide-simeth (MAALOX/MYLANTA) 200-200-20  MG/5ML suspension 30 mL  30 mL Oral Q4H PRN Evette Georges, NP       ARIPiprazole (ABILIFY) tablet 5 mg  5 mg Oral Daily Babita Amaker, NP   5 mg at 07/29/22 0734   diphenhydrAMINE (BENADRYL) capsule 50 mg  50 mg Oral TID PRN Evette Georges, NP       Or   diphenhydrAMINE (BENADRYL) injection 50 mg  50 mg Intramuscular TID PRN Evette Georges, NP       Derrill Memo ON 07/30/2022] FLUoxetine (PROZAC) capsule 20 mg  20 mg Oral Daily Nicholes Rough, NP  haloperidol (HALDOL) tablet 5 mg  5 mg Oral TID PRN Evette Georges, NP       Or   haloperidol lactate (HALDOL) injection 5 mg  5 mg Intramuscular TID PRN Evette Georges, NP       hydrOXYzine (ATARAX) tablet 25 mg  25 mg Oral Q6H PRN Ajibola, Ene A, NP   25 mg at 07/28/22 1642   LORazepam (ATIVAN) tablet 2 mg  2 mg Oral TID PRN Evette Georges, NP       Or   LORazepam (ATIVAN) injection 2 mg  2 mg Intramuscular TID PRN Evette Georges, NP       magnesium hydroxide (MILK OF MAGNESIA) suspension 30 mL  30 mL Oral Daily PRN Evette Georges, NP       nicotine (NICODERM CQ - dosed in mg/24 hours) patch 14 mg  14 mg Transdermal Daily Ajibola, Ene A, NP   14 mg at 07/29/22 0730   potassium chloride SA (KLOR-CON M) CR tablet 40 mEq  40 mEq Oral BID Nicholes Rough, NP   40 mEq at 07/29/22 0940   traZODone (DESYREL) tablet 100 mg  100 mg Oral QHS Nicholes Rough, NP        Lab Results:  Results for orders placed or performed during the hospital encounter of 07/27/22 (from the past 48 hour(s))  Urinalysis, Routine w reflex microscopic -Urine, Clean Catch     Status: Abnormal   Collection Time: 07/28/22  9:27 PM  Result Value Ref Range   Color, Urine YELLOW YELLOW   APPearance CLOUDY (A) CLEAR   Specific Gravity, Urine 1.014 1.005 - 1.030   pH 7.0 5.0 - 8.0   Glucose, UA NEGATIVE NEGATIVE mg/dL   Hgb urine dipstick NEGATIVE NEGATIVE   Bilirubin Urine NEGATIVE NEGATIVE   Ketones, ur NEGATIVE NEGATIVE mg/dL   Protein, ur NEGATIVE NEGATIVE mg/dL   Nitrite NEGATIVE  NEGATIVE   Leukocytes,Ua NEGATIVE NEGATIVE    Comment: Performed at [facility], [place] [address] [place], [place] [id number]  Basic metabolic panel     Status: Abnormal   Collection Time: 07/29/22  6:38 AM  Result Value Ref Range   Sodium 140 135 - 145 mmol/L   Potassium 3.2 (L) 3.5 - 5.1 mmol/L   Chloride 103 98 - 111 mmol/L   CO2 26 22 - 32 mmol/L   Glucose, Bld 103 (H) 70 - 99 mg/dL    Comment: Glucose reference range applies only to samples taken after fasting for at least 8 hours.   BUN 10 6 - 20 mg/dL   Creatinine, Ser 0.88 0.61 - 1.24 mg/dL   Calcium 9.2 8.9 - 10.3 mg/dL   GFR, Estimated >60 >60 mL/min    Comment: (NOTE) Calculated using the CKD-EPI Creatinine Equation (2021)    Anion gap 11 5 - 15    Comment: Performed at [facility], [place] [address] [place], [place] [address]    Blood Alcohol level:  Lab Results  Component Value Date   ETH <10 [id number]    Metabolic Disorder Labs: Lab Results  Component Value Date   HGBA1C 5.1 07/27/2022   MPG 100 07/27/2022   No results found for: "PROLACTIN" Lab Results  Component Value Date   CHOL 155 07/27/2022   TRIG 91 07/27/2022   HDL 41 07/27/2022   CHOLHDL 3.8 07/27/2022   VLDL 18 07/27/2022   LDLCALC 96 07/27/2022    Physical Findings: AIMS:  , ,  ,  ,  CIWA:    COWS:     Musculoskeletal: Strength & Muscle Tone: within normal limits Gait & Station: normal Patient leans: N/A  Psychiatric Specialty Exam:  Presentation  General Appearance:  Fairly Groomed  Eye Contact: Good  Speech: Clear and Coherent  Speech Volume: Normal  Handedness: Right   Mood and Affect  Mood: Depressed  Affect: Congruent   Thought Process  Thought Processes: Coherent  Descriptions of Associations:Circumstantial  Orientation:Full (Time, Place and Person)  Thought Content:Illogical; Paranoid Ideation; Tangential; Perseveration; Rumination  History of  Schizophrenia/Schizoaffective disorder:No  Duration of Psychotic Symptoms:Greater than six months  Hallucinations:Hallucinations: None  Ideas of Reference:Delusions  Suicidal Thoughts:Suicidal Thoughts: No SI Active Intent and/or Plan: Without Intent; Without Plan  Homicidal Thoughts:Homicidal Thoughts: No   Sensorium  Memory: Immediate Good  Judgment: Poor  Insight: Poor   Executive Functions  Concentration: Fair  Attention Span: Fair  Recall: [place] of Knowledge: Fair  Language: Fair   Psychomotor Activity  Psychomotor Activity: Psychomotor Activity: Normal   Assets  Assets: Communication Skills   Sleep  Sleep: Sleep: Fair    Physical Exam: Physical Exam Constitutional:      Appearance: Normal appearance.  HENT:     Nose: Nose normal.  Eyes:     Pupils: Pupils are equal, round, and reactive to light.  Musculoskeletal:        General: Normal range of motion.  Neurological:     Mental Status: He is alert and oriented to person, place, and time.     Sensory: No sensory deficit.  Psychiatric:        Thought Content: Thought content normal.    Review of Systems  Constitutional: Negative.   HENT: Negative.    Eyes: Negative.   Respiratory: Negative.    Cardiovascular: Negative.   Gastrointestinal:  Negative for heartburn.  Genitourinary: Negative.   Musculoskeletal: Negative.   Skin: Negative.   Neurological:  Negative for dizziness.  Psychiatric/Behavioral:  Positive for depression, hallucinations and substance abuse. Negative for memory loss and suicidal ideas. The patient is nervous/anxious and has insomnia.    Blood pressure 126/76, pulse (!) 115, temperature 98.1 F (36.7 C), temperature source Oral, resp. rate 20, height '5\' 11"'$  (1.803 m), weight 82.6 kg, SpO2 100 %. Body mass index is 25.38 kg/m.   Treatment Plan Summary: Daily contact with patient to assess and evaluate symptoms and progress in treatment Treatment  Plan Summary: Daily contact with patient to assess and evaluate symptoms and progress in treatment   Observation Level/Precautions:  15 minute checks  Laboratory:  Labs reviewed   Psychotherapy:  Unit Group sessions  Medications:  See [facility]  Consultations:  To be determined   Discharge Concerns:  Safety, medication compliance, mood stability  Estimated LOS: 5-7 days  Other:  N/A    Labs independently reviewed on 07/29/2022: Tox screen + THC, Resp panel negative, TSH WNL, lipid panel WNL, hemoglobin A1c WNL, potassium was 3.0 on admission, and was replaced in ED with 40 MED.  Rechecked BMP in 10/10 and was 3.2. Given 40 meq x 2 doses, will recheck again on 10/11.  CBC reviewed.UDS positive for THC. UA cloudy in appearance otherwise WNL. Baseline EKG with Qtc 398.       Labs ordered: BMP.  PLAN Safety and Monitoring: Voluntary admission to inpatient psychiatric unit for safety, stabilization and treatment Daily contact with patient to assess and evaluate symptoms and progress in treatment Patient's case to be discussed in multi-disciplinary team meeting Observation  Level : q15 minute checks Vital signs: q12 hours Precautions: Safety   Long Term Goal(s): Improvement in symptoms so as ready for discharge   Short Term Goals: Ability to identify changes in lifestyle to reduce recurrence of condition will improve, Ability to verbalize feelings will improve, Ability to disclose and discuss suicidal ideas, Ability to identify and develop effective coping behaviors will improve, Ability to maintain clinical measurements within normal limits will improve, Compliance with prescribed medications will improve, and Ability to identify triggers associated with substance abuse/mental health issues will improve   Diagnoses Active Problems:   MDD (major depressive disorder), recurrent, severe, with psychosis (HCC)   Anxiety   Delta-9-tetrahydrocannabinol (THC) dependence (HCC)   Medications   -Continue Abilify 5 mg daily for mood stabilization and psychosis -Give Potassium 40 MEQ x 2 doses, then repeat BMP on 3/11. -Start Prozac 20 mg daily starting 3/11 for depressive symptoms -Increase trazodone to 100 mg nightly for sleep -Continue nicotine patch daily for nicotine addiction -Continue hydroxyzine 25 mg 3 times daily as needed for anxiety -Continue agitation protocol: Ativan/Haldol/Benadryl-please see MAR for complete order   ANTIPSYCHOTIC CONSENT : We discussed the risks, benefits, side effects, and alternatives to THE PRESCRIBED ANTIPSYCHOTIC, including but not limited to, the risk of fatigue, sedation, metabolic syndrome, weight gain, movement abnormalities such as tremor & cogwheeling & tardive dyskinesia, temperature sensitivity, photosensitivity, blood pressure changes, heart rhythm effects, potential for medication interactions, and to not take these medications with alcohol or illicit drugs; informed consent was obtained. We discussed the necessity for routine monitoring including rating scales of abnormal movements, blood work, and ekgs, while the patient is prescribed antipsychotic medication.    No TD/EPS type symptoms found on assessment, and pt denies any feelings of stiffness. AIMS: 0.   Other PRNS -Continue Tylenol 650 mg every 6 hours PRN for mild pain -Continue Maalox 30 mg every 4 hrs PRN for indigestion -Continue Imodium 2-4 mg as needed for diarrhea -Continue Milk of Magnesia as needed every 6 hrs for constipation -Continue Zofran disintegrating tabs every 6 hrs PRN for nausea   Discharge Planning: Social work and case management to assist with discharge planning and identification of hospital follow-up needs prior to discharge Estimated LOS: 5-7 days Discharge Concerns: Need to establish a safety plan; Medication compliance and effectiveness Discharge Goals: Return home with outpatient referrals for mental health follow-up including medication  management/psychotherapy   I certify that inpatient services furnished can reasonably be expected to improve the patient's condition.    Nicholes Rough, NP 07/29/2022, 4:56 PM

## 2022-07-29 NOTE — Progress Notes (Signed)
   07/29/22 2238  Psych Admission Type (Psych Patients Only)  Admission Status Voluntary  Psychosocial Assessment  Patient Complaints Anxiety  Eye Contact Fair  Facial Expression Animated  Affect Appropriate to circumstance  Speech Logical/coherent  Interaction Assertive  Motor Activity Other (Comment) (WDL)  Appearance/Hygiene Unremarkable  Behavior Characteristics Appropriate to situation  Mood Pleasant  Thought Process  Coherency WDL  Content WDL  Delusions None reported or observed  Perception WDL  Hallucination None reported or observed  Judgment Poor  Confusion None  Danger to Self  Current suicidal ideation? Denies  Self-Injurious Behavior No self-injurious ideation or behavior indicators observed or expressed   Agreement Not to Harm Self Yes  Description of Agreement verbal  Danger to Others  Danger to Others None reported or observed

## 2022-07-29 NOTE — Group Note (Signed)
Date:  07/29/2022 Time:  4:32 PM  Group Topic/Focus:  Goals Group:   The focus of this group is to help patients establish daily goals to achieve during treatment and discuss how the patient can incorporate goal setting into their daily lives to aide in recovery.    Participation Level:  Minimal  Participation Quality:  Attentive  Affect:  Blunted  Cognitive:  Oriented  Insight: Good  Engagement in Group:  Lacking  Modes of Intervention:  Discussion  Additional Comments:     Jerrye Beavers 07/29/2022, 4:32 PM

## 2022-07-29 NOTE — BHH Group Notes (Signed)
The focus of this group is to help patients review their daily goal of treatment and discuss progress on daily workbooks. Pt was attentive and appropriate for tonight's wrap up group. Pt was able to share that overall day was good. He continue to talk and open up more to peers and staff.

## 2022-07-29 NOTE — Group Note (Signed)
Date:  07/29/2022 Time:  9:49 AM  Group Topic/Focus:  Dimensions of Wellness:   The focus of this group is to introduce the topic of wellness and discuss the role each dimension of wellness plays in total health.    Participation Level:  Active  Participation Quality:  Appropriate  Affect:  Appropriate  Cognitive:  Appropriate  Insight: Appropriate and Good  Engagement in Group:  Lacking  Modes of Intervention:  Discussion  Additional Comments:   Patient defined wellness as being physically well.  Ryan Horne 07/29/2022, 9:49 AM

## 2022-07-29 NOTE — Progress Notes (Addendum)
D. Pt has been appropriate on the unit- visible in the milieu interacting well with peers, observed attending groups. Per pt's self inventory, pt rated his depression,hopelessness and anxiety a 7/4/7, respectively.  Pt currently denies SI/HI and AVH and does not appear to be responding to internal stimuli. A. Labs and vitals monitored. Pt given and educated on medications. Pt supported emotionally and encouraged to express concerns and ask questions.   R. Pt remains safe with 15 minute checks. Will continue POC.    07/29/22 1400  Psych Admission Type (Psych Patients Only)  Admission Status Voluntary  Psychosocial Assessment  Patient Complaints Anxiety;Depression  Eye Contact Fair  Facial Expression Sad  Affect Appropriate to circumstance  Speech Logical/coherent  Interaction Minimal  Motor Activity Other (Comment) (steady gait)  Appearance/Hygiene Unremarkable  Behavior Characteristics Cooperative;Appropriate to situation  Mood Pleasant  Thought Process  Coherency WDL  Content WDL  Delusions None reported or observed  Perception WDL  Hallucination None reported or observed  Judgment Poor  Confusion None  Danger to Self  Current suicidal ideation? Denies  Danger to Others  Danger to Others None reported or observed

## 2022-07-29 NOTE — Progress Notes (Signed)
EKG results placed on the outside of patient's shadow chart  Normal sinus rhythm Normal ECG  QT/Qtc-Baz  364/398 ms

## 2022-07-30 ENCOUNTER — Encounter (HOSPITAL_COMMUNITY): Payer: Self-pay

## 2022-07-30 DIAGNOSIS — F259 Schizoaffective disorder, unspecified: Secondary | ICD-10-CM

## 2022-07-30 LAB — BASIC METABOLIC PANEL
Anion gap: 7 (ref 5–15)
BUN: 10 mg/dL (ref 6–20)
CO2: 27 mmol/L (ref 22–32)
Calcium: 9.2 mg/dL (ref 8.9–10.3)
Chloride: 103 mmol/L (ref 98–111)
Creatinine, Ser: 1.01 mg/dL (ref 0.61–1.24)
GFR, Estimated: >60 mL/min (ref >60)
Glucose, Bld: 90 mg/dL (ref 70–99)
Potassium: 3.8 mmol/L (ref 3.5–5.1)
Sodium: 137 mmol/L (ref 135–145)

## 2022-07-30 MED ORDER — ARIPIPRAZOLE 10 MG PO TABS
10.0000 mg | ORAL_TABLET | Freq: Every day | ORAL | Status: DC
  Administered 2022-07-31 – 2022-08-03 (×4): 10 mg via ORAL
  Filled 2022-07-30 (×5): qty 1

## 2022-07-30 MED ORDER — PROPRANOLOL HCL 10 MG PO TABS
10.0000 mg | ORAL_TABLET | Freq: Two times a day (BID) | ORAL | Status: DC
  Administered 2022-07-30 – 2022-08-03 (×8): 10 mg via ORAL
  Filled 2022-07-30 (×13): qty 1

## 2022-07-30 MED ORDER — ARIPIPRAZOLE 5 MG PO TABS
5.0000 mg | ORAL_TABLET | Freq: Once | ORAL | Status: AC
  Administered 2022-07-30: 5 mg via ORAL
  Filled 2022-07-30 (×2): qty 1

## 2022-07-30 NOTE — Group Note (Signed)
Date:  07/30/2022 Time:  10:14 AM  Group Topic/Focus:  Goals Group:   The focus of this group is to help patients establish daily goals to achieve during treatment and discuss how the patient can incorporate goal setting into their daily lives to aide in recovery.    Participation Level:  Active  Participation Quality:  Appropriate  Affect:  Appropriate  Cognitive:  Appropriate  Insight: Appropriate  Engagement in Group:  Engaged  Modes of Intervention:  Activity and Education  Additional Comments:   Pt attended and actively participated in the goals group. 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   Barbaraann Share 07/30/2022, 10:14 AM

## 2022-07-30 NOTE — Progress Notes (Signed)
[facility] MD Progress Note  07/30/2022 4:11 PM Ryan Horne  MRN:  [id number] Principal Problem: Schizoaffective disorder, depressive type ([place]) Diagnosis: Principal Problem:   Schizoaffective disorder, depressive type ([place]) Active Problems:   Anxiety   Delta-9-tetrahydrocannabinol (THC) dependence ([place])  Reason for Admission: Ryan Horne is a 22 yo male with prior mental health diagnoses of MDD who presented to the [place] [facility] urgent care [facility]) on 03/08 accompanied by his mother with complaints of over thinking & SI "with a plan of anything quick"  as per [facility] documentation. Pt also reported VH of his emotions.  Patient was transferred voluntarily to this behavioral health Hospital for treatment and stabilization of his mental status.   24 hr chart review: HR remains intermittently elevated for the past 24 hrs. It was 116 earlier today morning. Patient is compliant with all scheduled medications, hydroxyzine 25 mg last given earlier today morning. No PRN medications given overnight. Pt is visible in the dayroom and is continuing to participate in unit group sessions.  Patient assessment note, 07/30/2022: Mood today is less depressed as per both subjective and objective assessments of patient.  Patient denies SI, denies HI, denies AVH today, continues to present with delusional thoughts and is religiously preoccupied, but to a lesser extent as compared to yesterday and at admission.  Speech is clearer, more logical, more organized, less circumstantial.  He continues to state that he gets special messages just for him from music made by "Kaan" and "Leroyce", and people are able to insert thoughts into his brain, continues to report feelings of paranoia, and continues to be preoccupied with religion, and states "everyone, understands the same thing, but not working on it." When asked for clarification, he states that everyone understands to respect each other, but everyone is not  respecting each other. Pt reports that his racing thoughts have reduced in intensity with current medication regimen.  He rates depression today as being between 5 and 6, 10 being worst.  He rates anxiety as 7, 10 being worst, states that hydroxyzine helps, states that he is especially bothered by being in the midst of people.  He reports back pain of 4-5, 10 being worst, states it was preexistent prior to admission, educated that he has Tylenol ordered as needed and to ask his nurse for it.  Patient reports a dry mouth, provided with a pitcher filled with water, educated to bring it to his nurse to feel as needed.  Verbalizes understanding, denies other medication related side effects, states he is tolerating current medications well.  We will add Inderal 10 mg twice daily for tachycardia, and continue other medications as listed below.No TD/EPS type symptoms found on assessment, and pt denies any feelings of stiffness. AIMS: 0.   Past Psychiatric History: MDD  Past Medical History:  Past Medical History:  Diagnosis Date   Anxiety     Past Surgical History:  Procedure Laterality Date   ADENOIDECTOMY  2009   TONSILLECTOMY     Family History:  Family History  Problem Relation Age of Onset   Anxiety disorder Mother    Depression Mother    Family Psychiatric  History: MDD Social History:  Social History   Substance and Sexual Activity  Alcohol Use Not Currently     Social History   Substance and Sexual Activity  Drug Use Yes   Types: Marijuana    Social History   Socioeconomic History   Marital status: Single    Spouse name:  Not on file   Number of children: Not on file   Years of education: 11   Highest education level: 11th grade  Occupational History   Not on file  Tobacco Use   Smoking status: Some Days    Types: Cigarettes   Smokeless tobacco: Never  Vaping Use   Vaping Use: Never used  Substance and Sexual Activity   Alcohol use: Not Currently   Drug use: Yes     Types: Marijuana   Sexual activity: Not Currently  Other Topics Concern   Not on file  Social History Narrative   Not on file   Social Determinants of Health   Financial Resource Strain: Not on file  Food Insecurity: No Food Insecurity (07/27/2022)   Hunger Vital Sign    Worried About Running Out of Food in the Last Year: Never true    Ran Out of Food in the Last Year: Never true  Transportation Needs: No Transportation Needs (07/27/2022)   PRAPARE - Hydrologist (Medical): No    Lack of Transportation (Non-Medical): No  Physical Activity: Not on file  Stress: Not on file  Social Connections: Not on file   Sleep: Fair  Appetite:  Good  Current Medications: Current Facility-Administered Medications  Medication Dose Route Frequency Provider Last Rate Last Admin   acetaminophen (TYLENOL) tablet 650 mg  650 mg Oral Q6H PRN Evette Georges, NP   650 mg at 07/30/22 1424   alum & mag hydroxide-simeth (MAALOX/MYLANTA) 200-200-20 MG/5ML suspension 30 mL  30 mL Oral Q4H PRN Evette Georges, NP       Derrill Memo ON 07/31/2022] ARIPiprazole (ABILIFY) tablet 10 mg  10 mg Oral Daily Mykael Batz, Tamela Oddi, NP       diphenhydrAMINE (BENADRYL) capsule 50 mg  50 mg Oral TID PRN Evette Georges, NP       Or   diphenhydrAMINE (BENADRYL) injection 50 mg  50 mg Intramuscular TID PRN Evette Georges, NP       FLUoxetine (PROZAC) capsule 20 mg  20 mg Oral Daily Nicholes Rough, NP   20 mg at 07/30/22 0750   haloperidol (HALDOL) tablet 5 mg  5 mg Oral TID PRN Evette Georges, NP       Or   haloperidol lactate (HALDOL) injection 5 mg  5 mg Intramuscular TID PRN Evette Georges, NP       hydrOXYzine (ATARAX) tablet 25 mg  25 mg Oral Q6H PRN Ajibola, Ene A, NP   25 mg at 07/30/22 0750   LORazepam (ATIVAN) tablet 2 mg  2 mg Oral TID PRN Evette Georges, NP       Or   LORazepam (ATIVAN) injection 2 mg  2 mg Intramuscular TID PRN Evette Georges, NP       magnesium hydroxide (MILK OF MAGNESIA) suspension  30 mL  30 mL Oral Daily PRN Evette Georges, NP       nicotine (NICODERM CQ - dosed in mg/24 hours) patch 14 mg  14 mg Transdermal Daily Ajibola, Ene A, NP   14 mg at 07/30/22 0750   propranolol (INDERAL) tablet 10 mg  10 mg Oral BID Nicholes Rough, NP       traZODone (DESYREL) tablet 100 mg  100 mg Oral QHS Nicholes Rough, NP   100 mg at 07/29/22 2147    Lab Results:  Results for orders placed or performed during the hospital encounter of 07/27/22 (from the past 48 hour(s))  Urinalysis, Routine w reflex microscopic -Urine, Clean  Catch     Status: Abnormal   Collection Time: 07/28/22  9:27 PM  Result Value Ref Range   Color, Urine YELLOW YELLOW   APPearance CLOUDY (A) CLEAR   Specific Gravity, Urine 1.014 1.005 - 1.030   pH 7.0 5.0 - 8.0   Glucose, UA NEGATIVE NEGATIVE mg/dL   Hgb urine dipstick NEGATIVE NEGATIVE   Bilirubin Urine NEGATIVE NEGATIVE   Ketones, ur NEGATIVE NEGATIVE mg/dL   Protein, ur NEGATIVE NEGATIVE mg/dL   Nitrite NEGATIVE NEGATIVE   Leukocytes,Ua NEGATIVE NEGATIVE    Comment: Performed at [facility] [address] [place], [place] [id number]  Basic metabolic panel     Status: Abnormal   Collection Time: 07/29/22  6:38 AM  Result Value Ref Range   Sodium 140 135 - 145 mmol/L   Potassium 3.2 (L) 3.5 - 5.1 mmol/L   Chloride 103 98 - 111 mmol/L   CO2 26 22 - 32 mmol/L   Glucose, Bld 103 (H) 70 - 99 mg/dL    Comment: Glucose reference range applies only to samples taken after fasting for at least 8 hours.   BUN 10 6 - 20 mg/dL   Creatinine, Ser 0.88 0.61 - 1.24 mg/dL   Calcium 9.2 8.9 - 10.3 mg/dL   GFR, Estimated >60 >60 mL/min    Comment: (NOTE) Calculated using the CKD-EPI Creatinine Equation (2021)    Anion gap 11 5 - 15    Comment: Performed at [facility], [place] [address], [place], [place] [id number]  Basic metabolic panel     Status: None   Collection Time: 07/30/22  6:19 AM  Result Value Ref Range   Sodium 137  135 - 145 mmol/L   Potassium 3.8 3.5 - 5.1 mmol/L   Chloride 103 98 - 111 mmol/L   CO2 27 22 - 32 mmol/L   Glucose, Bld 90 70 - 99 mg/dL    Comment: Glucose reference range applies only to samples taken after fasting for at least 8 hours.   BUN 10 6 - 20 mg/dL   Creatinine, Ser 1.01 0.61 - 1.24 mg/dL   Calcium 9.2 8.9 - 10.3 mg/dL   GFR, Estimated >60 >60 mL/min    Comment: (NOTE) Calculated using the CKD-EPI Creatinine Equation (2021)    Anion gap 7 5 - 15    Comment: Performed at [facility], [place] [address] [place], [place] [address]    Blood Alcohol level:  Lab Results  Component Value Date   ETH <10 [id number]    Metabolic Disorder Labs: Lab Results  Component Value Date   HGBA1C 5.1 07/27/2022   MPG 100 07/27/2022   No results found for: "PROLACTIN" Lab Results  Component Value Date   CHOL 155 07/27/2022   TRIG 91 07/27/2022   HDL 41 07/27/2022   CHOLHDL 3.8 07/27/2022   VLDL 18 07/27/2022   LDLCALC 96 07/27/2022    Physical Findings: AIMS: 0  CIWA: n/a COWS:  0   Musculoskeletal: Strength & Muscle Tone: within normal limits Gait & Station: normal Patient leans: N/A  Psychiatric Specialty Exam:  Presentation  General Appearance:  Appropriate for Environment; Fairly Groomed  Eye Contact: Fair  Speech: Clear and Coherent  Speech Volume: Normal  Handedness: Right   Mood and Affect  Mood: Depressed; Anxious  Affect: Congruent   Thought Process  Thought Processes: Coherent  Descriptions of Associations:Intact  Orientation:Full (Time, Place and Person)  Thought Content:Logical  History of Schizophrenia/Schizoaffective disorder:No  Duration of Psychotic Symptoms:Greater than six months  Hallucinations:Hallucinations: None  Ideas of Reference:Paranoia; Delusions  Suicidal Thoughts:Suicidal Thoughts: No  Homicidal Thoughts:Homicidal Thoughts: No   Sensorium  Memory: Immediate  Good  Judgment: Fair  Insight: Fair   Materials engineer: Fair  Attention Span: Fair  Recall: Flasher of Knowledge: Fair  Language: Fair   Psychomotor Activity  Psychomotor Activity: Psychomotor Activity: Normal   Assets  Assets: Communication Skills; Resilience   Sleep  Sleep: Sleep: Fair    Physical Exam: Physical Exam Constitutional:      Appearance: Normal appearance.  HENT:     Nose: Nose normal.  Eyes:     Pupils: Pupils are equal, round, and reactive to light.  Musculoskeletal:        General: Normal range of motion.  Neurological:     Mental Status: He is alert and oriented to person, place, and time.     Sensory: No sensory deficit.  Psychiatric:        Thought Content: Thought content normal.    Review of Systems  Constitutional: Negative.   HENT: Negative.    Eyes: Negative.   Respiratory: Negative.    Cardiovascular: Negative.   Gastrointestinal:  Negative for heartburn.  Genitourinary: Negative.   Musculoskeletal: Negative.   Skin: Negative.   Neurological:  Negative for dizziness.  Psychiatric/Behavioral:  Positive for depression, hallucinations and substance abuse. Negative for memory loss and suicidal ideas. The patient is nervous/anxious and has insomnia.    Blood pressure 120/76, pulse (!) 116, temperature (!) 97.5 F (36.4 C), temperature source Oral, resp. rate 20, height '5\' 11"'$  (1.803 m), weight 82.6 kg, SpO2 98 %. Body mass index is 25.38 kg/m.   Treatment Plan Summary: Daily contact with patient to assess and evaluate symptoms and progress in treatment Treatment Plan Summary: Daily contact with patient to assess and evaluate symptoms and progress in treatment   Observation Level/Precautions:  15 minute checks  Laboratory:  Labs reviewed   Psychotherapy:  Unit Group sessions  Medications:  See [facility]  Consultations:  To be determined   Discharge Concerns:  Safety, medication compliance, mood  stability  Estimated LOS: 5-7 days  Other:  N/A    Labs independently reviewed on 07/30/2022: Tox screen + THC, Resp panel negative, TSH WNL, lipid panel WNL, hemoglobin A1c WNL, potassium was 3.0 on admission, and was replaced in ED with 40 MED.  Rechecked BMP in 10/10 and was 3.2. Given 40 meq x 2 doses,  rechecked again on 10/11, now WNL at 3.8.  CBC reviewed.UDS positive for THC. UA cloudy in appearance otherwise WNL. Baseline EKG with Qtc 398.       Labs ordered: BMP.  PLAN Safety and Monitoring: Voluntary admission to inpatient psychiatric unit for safety, stabilization and treatment Daily contact with patient to assess and evaluate symptoms and progress in treatment Patient's case to be discussed in multi-disciplinary team meeting Observation Level : q15 minute checks Vital signs: q12 hours Precautions: Safety   Long Term Goal(s): Improvement in symptoms so as ready for discharge   Short Term Goals: Ability to identify changes in lifestyle to reduce recurrence of condition will improve, Ability to verbalize feelings will improve, Ability to disclose and discuss suicidal ideas, Ability to identify and develop effective coping behaviors will improve, Ability to maintain clinical measurements within normal limits will improve, Compliance with prescribed medications will improve, and Ability to identify triggers associated with substance abuse/mental health issues will improve   Diagnoses  Active Problems:   MDD (major depressive disorder), recurrent, severe, with psychosis ([facility])   Anxiety   Delta-9-tetrahydrocannabinol (THC) dependence ([facility])   Medications  -Start Inderal 10 mg BID for psychosis -Increase Abilify to 10 mg daily for mood stabilization and psychosis -Given Potassium 40 MEQ x 2 doses on 3/10-completed-K WNL. -Continue Prozac 20 mg daily starting 3/11 for depressive symptoms -Continue trazodone to 100 mg nightly for sleep -Continue nicotine patch daily for nicotine  addiction -Continue hydroxyzine 25 mg 3 times daily as needed for anxiety -Continue agitation protocol: Ativan/Haldol/Benadryl-please see MAR for complete order   ANTIPSYCHOTIC CONSENT : We discussed the risks, benefits, side effects, and alternatives to THE PRESCRIBED ANTIPSYCHOTIC, including but not limited to, the risk of fatigue, sedation, metabolic syndrome, weight gain, movement abnormalities such as tremor & cogwheeling & tardive dyskinesia, temperature sensitivity, photosensitivity, blood pressure changes, heart rhythm effects, potential for medication interactions, and to not take these medications with alcohol or illicit drugs; informed consent was obtained. We discussed the necessity for routine monitoring including rating scales of abnormal movements, blood work, and ekgs, while the patient is prescribed antipsychotic medication.    No TD/EPS type symptoms found on assessment, and pt denies any feelings of stiffness. AIMS: 0.   Other PRNS -Continue Tylenol 650 mg every 6 hours PRN for mild pain -Continue Maalox 30 mg every 4 hrs PRN for indigestion -Continue Imodium 2-4 mg as needed for diarrhea -Continue Milk of Magnesia as needed every 6 hrs for constipation -Continue Zofran disintegrating tabs every 6 hrs PRN for nausea   Discharge Planning: Social work and case management to assist with discharge planning and identification of hospital follow-up needs prior to discharge Estimated LOS: 5-7 days Discharge Concerns: Need to establish a safety plan; Medication compliance and effectiveness Discharge Goals: Return home with outpatient referrals for mental health follow-up including medication management/psychotherapy   I certify that inpatient services furnished can reasonably be expected to improve the patient's condition.    Nicholes Rough, NP 07/30/2022, 4:11 PMPatient ID: Ryan Horne, male   DOB: Dec 02, 2000, 22 y.o.   MRN: [id number]

## 2022-07-30 NOTE — B[facility] Group Notes (Signed)
Spiritual care group on grief and loss facilitated by Chaplain Janne Napoleon, Bcc and Lysle Morales, counseling intern.  Group Goal: Support / Education around grief and loss  Members engage in facilitated group support and psycho-social education.  Group Description:  Following introductions and group rules, group members engaged in facilitated group dialogue and support around topic of loss, with particular support around experiences of loss in their lives. Group Identified types of loss (relationships / self / things) and identified patterns, circumstances, and changes that precipitate losses. Reflected on thoughts / feelings around loss, normalized grief responses, and recognized variety in grief experience. Group encouraged individual reflection on safe space and on the coping skills that they are already utilizing.  Group drew on Adlerian / Rogerian and narrative framework  Patient Progress: Ryan Horne attended group and actively engaged and participated in group activities and conversation. His comments were on topic and demonstrated good insight into the topic.  [address], [place] Pager, [phone]

## 2022-07-30 NOTE — Group Note (Signed)
Occupational Therapy Group Note  Group Topic:Coping Skills  Group Date: 07/30/2022 Start Time: 1430 End Time: 1500 Facilitators: Brantley Stage, OT   Group Description: Group encouraged increased engagement and participation through discussion and activity focused on "Coping Ahead." Patients were split up into teams and selected a card from a stack of positive coping strategies. Patients were instructed to act out/charade the coping skill for other peers to guess and receive points for their team. Discussion followed with a focus on identifying additional positive coping strategies and patients shared how they were going to cope ahead over the weekend while continuing hospitalization stay.  Therapeutic Goal(s): Identify positive vs negative coping strategies. Identify coping skills to be used during hospitalization vs coping skills outside of hospital/at home Increase participation in therapeutic group environment and promote engagement in treatment   Participation Level: Engaged   Participation Quality: Independent   Behavior: Appropriate   Speech/Thought Process: Relevant   Affect/Mood: Flat   Insight: Fair   Judgement: Fair   Individualization: pt was engaged in their participation of group discussion/activity. New skills were identified  Modes of Intervention: Education  Patient Response to Interventions:  Attentive   Plan: Continue to engage patient in OT groups 2 - 3x/week.  07/30/2022  Brantley Stage, OT  Cornell Barman, OT

## 2022-07-30 NOTE — Group Note (Signed)
Recreation Therapy Group Note   Group Topic:Health and Wellness  Group Date: 07/30/2022 Start Time: 0940 End Time: 0957 Facilitators: Kamen Hanken-McCall, LRT,CTRS Location: [address] Dayroom   Goal Area(s) Addresses:  Patient will define components of whole wellness. Patient will verbalize benefit of whole wellness.  Group Description:  Exercise.  LRT and patients discussed the importance of exercise.  LRT and patients then completed a short exercise session.  LRT explained to patients they would take turns leading the group in exercises of their choosing.  Patients led group in everything from push-ups, planks, jumping jacks, etc.  Patients were instructed to take breaks and get water as needed.   Affect/Mood: Appropriate   Participation Level: Engaged   Participation Quality: Independent   Behavior: Appropriate   Speech/Thought Process: Focused   Insight: Good   Judgement: Good   Modes of Intervention: Music   Patient Response to Interventions:  Engaged   Education Outcome:  Acknowledges education and In group clarification offered    Clinical Observations/Individualized Feedback: Pt was bright and attentive during group session. Pt was also social with peers as well.     Plan: Continue to engage patient in RT group sessions 2-3x/week.   Miqueas Whilden-McCall, LRT,CTRS 07/30/2022 12:36 PM

## 2022-07-30 NOTE — BH IP Treatment Plan (Signed)
Interdisciplinary Treatment and Diagnostic Plan Update  07/30/2022 Time of Session: 10:40am Ryan Horne MRN: [id number]  Principal Diagnosis: Schizoaffective disorder, depressive type [facility])  Secondary Diagnoses: Principal Problem:   Schizoaffective disorder, depressive type ([place]) Active Problems:   Anxiety   Delta-9-tetrahydrocannabinol (THC) dependence (HCC)   Current Medications:  Current Facility-Administered Medications  Medication Dose Route Frequency Provider Last Rate Last Admin   acetaminophen (TYLENOL) tablet 650 mg  650 mg Oral Q6H PRN Evette Georges, NP       alum & mag hydroxide-simeth (MAALOX/MYLANTA) 200-200-20 MG/5ML suspension 30 mL  30 mL Oral Q4H PRN Evette Georges, NP       Derrill Memo ON 07/31/2022] ARIPiprazole (ABILIFY) tablet 10 mg  10 mg Oral Daily Nkwenti, Doris, NP       diphenhydrAMINE (BENADRYL) capsule 50 mg  50 mg Oral TID PRN Evette Georges, NP       Or   diphenhydrAMINE (BENADRYL) injection 50 mg  50 mg Intramuscular TID PRN Evette Georges, NP       FLUoxetine (PROZAC) capsule 20 mg  20 mg Oral Daily Nicholes Rough, NP   20 mg at 07/30/22 0750   haloperidol (HALDOL) tablet 5 mg  5 mg Oral TID PRN Evette Georges, NP       Or   haloperidol lactate (HALDOL) injection 5 mg  5 mg Intramuscular TID PRN Evette Georges, NP       hydrOXYzine (ATARAX) tablet 25 mg  25 mg Oral Q6H PRN Ajibola, Ene A, NP   25 mg at 07/30/22 0750   LORazepam (ATIVAN) tablet 2 mg  2 mg Oral TID PRN Evette Georges, NP       Or   LORazepam (ATIVAN) injection 2 mg  2 mg Intramuscular TID PRN Evette Georges, NP       magnesium hydroxide (MILK OF MAGNESIA) suspension 30 mL  30 mL Oral Daily PRN Evette Georges, NP       nicotine (NICODERM CQ - dosed in mg/24 hours) patch 14 mg  14 mg Transdermal Daily Ajibola, Ene A, NP   14 mg at 07/30/22 0750   traZODone (DESYREL) tablet 100 mg  100 mg Oral QHS Nicholes Rough, NP   100 mg at 07/29/22 2147   PTA Medications: Medications Prior to Admission   Medication Sig Dispense Refill Last Dose   acetaminophen (TYLENOL) 325 MG tablet Take 2 tablets (650 mg total) by mouth every 6 (six) hours as needed for mild pain.      alum & mag hydroxide-simeth (MAALOX/MYLANTA) 200-200-20 MG/5ML suspension Take 30 mLs by mouth every 4 (four) hours as needed for indigestion. 355 mL 0    LORazepam (ATIVAN) 1 MG tablet Take 1 tablet (1 mg total) by mouth as needed for anxiety (severe agitation). 30 tablet 0    magnesium hydroxide (MILK OF MAGNESIA) 400 MG/5ML suspension Take 30 mLs by mouth daily as needed for mild constipation. 355 mL 0    OLANZapine zydis (ZYPREXA) 5 MG disintegrating tablet Take 1 tablet (5 mg total) by mouth every 8 (eight) hours as needed (agitation).      ziprasidone (GEODON) 20 MG injection Inject 20 mg into the muscle as needed for agitation. 1 each      Patient Stressors:    Patient Strengths:    Treatment Modalities: Medication Management, Group therapy, Case management,  1 to 1 session with clinician, Psychoeducation, Recreational therapy.   Physician Treatment Plan for Primary Diagnosis: Schizoaffective disorder, depressive type ([place]) Long Term Goal(s): Improvement in  symptoms so as ready for discharge   Short Term Goals: Ability to identify changes in lifestyle to reduce recurrence of condition will improve Ability to verbalize feelings will improve Ability to disclose and discuss suicidal ideas Ability to identify and develop effective coping behaviors will improve Ability to maintain clinical measurements within normal limits will improve Compliance with prescribed medications will improve Ability to identify triggers associated with substance abuse/mental health issues will improve  Medication Management: Evaluate patient's response, side effects, and tolerance of medication regimen.  Therapeutic Interventions: 1 to 1 sessions, Unit Group sessions and Medication administration.  Evaluation of Outcomes:  Progressing  Physician Treatment Plan for Secondary Diagnosis: Principal Problem:   Schizoaffective disorder, depressive type ([place]) Active Problems:   Anxiety   Delta-9-tetrahydrocannabinol (THC) dependence ([place])  Long Term Goal(s): Improvement in symptoms so as ready for discharge   Short Term Goals: Ability to identify changes in lifestyle to reduce recurrence of condition will improve Ability to verbalize feelings will improve Ability to disclose and discuss suicidal ideas Ability to identify and develop effective coping behaviors will improve Ability to maintain clinical measurements within normal limits will improve Compliance with prescribed medications will improve Ability to identify triggers associated with substance abuse/mental health issues will improve     Medication Management: Evaluate patient's response, side effects, and tolerance of medication regimen.  Therapeutic Interventions: 1 to 1 sessions, Unit Group sessions and Medication administration.  Evaluation of Outcomes: Progressing   RN Treatment Plan for Primary Diagnosis: Schizoaffective disorder, depressive type ([place]) Long Term Goal(s): Knowledge of disease and therapeutic regimen to maintain health will improve  Short Term Goals: Ability to remain free from injury will improve, Ability to verbalize frustration and anger appropriately will improve, Ability to demonstrate self-control, Ability to participate in decision making will improve, Ability to verbalize feelings will improve, Ability to disclose and discuss suicidal ideas, Ability to identify and develop effective coping behaviors will improve, and Compliance with prescribed medications will improve  Medication Management: RN will administer medications as ordered by provider, will assess and evaluate patient's response and provide education to patient for prescribed medication. RN will report any adverse and/or side effects to prescribing  provider.  Therapeutic Interventions: 1 on 1 counseling sessions, Psychoeducation, Medication administration, Evaluate responses to treatment, Monitor vital signs and CBGs as ordered, Perform/monitor CIWA, COWS, AIMS and Fall Risk screenings as ordered, Perform wound care treatments as ordered.  Evaluation of Outcomes: Progressing   LCSW Treatment Plan for Primary Diagnosis: Schizoaffective disorder, depressive type ([place]) Long Term Goal(s): Safe transition to appropriate next level of care at discharge, Engage patient in therapeutic group addressing interpersonal concerns.  Short Term Goals: Engage patient in aftercare planning with referrals and resources, Increase social support, Increase ability to appropriately verbalize feelings, Increase emotional regulation, Facilitate acceptance of mental health diagnosis and concerns, Facilitate patient progression through stages of change regarding substance use diagnoses and concerns, Identify triggers associated with mental health/substance abuse issues, and Increase skills for wellness and recovery  Therapeutic Interventions: Assess for all discharge needs, 1 to 1 time with Social worker, Explore available resources and support systems, Assess for adequacy in community support network, Educate family and significant other(s) on suicide prevention, Complete Psychosocial Assessment, Interpersonal group therapy.  Evaluation of Outcomes: Progressing   Progress in Treatment: Attending groups: Yes. Participating in groups: Yes. Taking medication as prescribed: Yes. Toleration medication: Yes. Family/Significant other contact made: Yes, individual(s) contacted:  Kalet Vanwye, mother, [phone] Patient understands diagnosis: Yes. Discussing patient identified  problems/goals with staff: Yes. Medical problems stabilized or resolved: Yes. Denies suicidal/homicidal ideation: Yes. Issues/concerns per patient self-inventory: No.   New problem(s)  identified: No, Describe:  none reported   New Short Term/Long Term Goal(s):   medication stabilization, elimination of SI thoughts, development of comprehensive mental wellness plan.    Patient Goals:  Pt states, "I want to find more ways to cope with my mental health."  Discharge Plan or Barriers: Patient recently admitted. CSW will continue to follow and assess for appropriate referrals and possible discharge planning.    Reason for Continuation of Hospitalization: Anxiety Depression Medication stabilization Suicidal ideation  Estimated Length of Stay: 3-5 days  Last 3 [place] Suicide Severity Risk Score: [place] Admission (Current) from 07/27/2022 in [place] [facility] Most recent reading at 07/27/2022 10:19 PM ED from 07/27/2022 in [facility] Most recent reading at 07/27/2022  7:52 AM Admission (Discharged) from 11/22/2018 in [place] Most recent reading at 11/22/2018  4:52 AM  C-SSRS RISK CATEGORY No Risk No Risk Moderate Risk       Last PHQ 2/9 Scores:    02/23/2020    3:34 PM  Depression screen PHQ 2/9  Decreased Interest 3  Down, Depressed, Hopeless 3  PHQ - 2 Score 6  Altered sleeping 3  Tired, decreased energy 1  Change in appetite 2  Feeling bad or failure about yourself  3  Trouble concentrating 2  Moving slowly or fidgety/restless 2  Suicidal thoughts 0  PHQ-9 Score 19  Difficult doing work/chores Somewhat difficult    Scribe for Treatment Team: Zachery Conch, LCSW 07/30/2022 1:21 PM

## 2022-07-30 NOTE — Progress Notes (Signed)
   07/30/22 2200  Psych Admission Type (Psych Patients Only)  Admission Status Voluntary  Psychosocial Assessment  Patient Complaints Anxiety  Eye Contact Fair  Facial Expression Animated;Flat  Affect Appropriate to circumstance  Speech Logical/coherent  Interaction Assertive  Motor Activity Slow  Appearance/Hygiene Unremarkable  Behavior Characteristics Cooperative;Appropriate to situation  Mood Pleasant  Thought Process  Coherency WDL  Content WDL  Delusions None reported or observed  Perception WDL  Hallucination None reported or observed  Judgment Poor  Confusion None  Danger to Self  Current suicidal ideation? Denies  Self-Injurious Behavior No self-injurious ideation or behavior indicators observed or expressed   Agreement Not to Harm Self Yes  Description of Agreement verbal  Danger to Others  Danger to Others None reported or observed

## 2022-07-30 NOTE — Progress Notes (Signed)
   07/30/22 1146  Psych Admission Type (Psych Patients Only)  Admission Status Voluntary  Psychosocial Assessment  Patient Complaints Anxiety  Eye Contact Fair  Facial Expression Animated  Affect Appropriate to circumstance  Speech Logical/coherent  Interaction Assertive  Motor Activity Other (Comment) ([facility])  Appearance/Hygiene Unremarkable  Behavior Characteristics Cooperative;Appropriate to situation  Mood Pleasant  Thought Process  Coherency WDL  Content WDL  Delusions None reported or observed  Perception WDL  Hallucination None reported or observed  Judgment Poor  Confusion None  Danger to Self  Current suicidal ideation? Denies  Self-Injurious Behavior No self-injurious ideation or behavior indicators observed or expressed   Agreement Not to Harm Self Yes  Description of Agreement verbal  Danger to Others  Danger to Others None reported or observed

## 2022-07-31 MED ORDER — NICOTINE POLACRILEX 2 MG MT GUM
2.0000 mg | CHEWING_GUM | OROMUCOSAL | Status: DC | PRN
  Administered 2022-07-31 – 2022-08-03 (×5): 2 mg via ORAL
  Filled 2022-07-31 (×6): qty 1

## 2022-07-31 NOTE — Progress Notes (Signed)
Patient signed a 72-hour request for discharge form on 07/31/22 at 0925.

## 2022-07-31 NOTE — Progress Notes (Signed)
D: Pt denied SI/HI/AVH this morning. Pt has been pleasant, calm, and cooperative throughout the shift.   A: RN provided support and encouragement to patient. Pt given scheduled medications as prescribed. Q15 min checks verified for safety.    R: Patient verbally contracts for safety. Patient compliant with medications and treatment plan. Patient is interacting well on the unit. Pt is safe on the unit.   07/31/22 1100  Psych Admission Type (Psych Patients Only)  Admission Status Voluntary/72 hour document signed  Date 72 hour document signed  07/30/32  Time 72 hour document signed  0925  Provider Notified (First and Last Name) (see details for LINK to note) Goli, MD  Psychosocial Assessment  Patient Complaints None  Eye Contact Fair  Facial Expression Animated  Affect Appropriate to circumstance  Speech Logical/coherent  Interaction Assertive  Motor Activity Slow  Appearance/Hygiene Unremarkable  Behavior Characteristics Cooperative  Mood Pleasant  Thought Process  Coherency WDL  Content WDL  Delusions None reported or observed  Perception WDL  Hallucination None reported or observed  Judgment Impaired  Confusion None  Danger to Self  Current suicidal ideation? Denies  Self-Injurious Behavior No self-injurious ideation or behavior indicators observed or expressed   Agreement Not to Harm Self Yes  Description of Agreement Verbal  Danger to Others  Danger to Others None reported or observed

## 2022-07-31 NOTE — Group Note (Signed)
LCSW Group Therapy Note   Group Date: 07/31/2022 Start Time: 1100 End Time: 1200  Type of Therapy and Topic: Group Therapy: Effective Communication   Participation Level: Active   Description of Group:  In this group patients will be asked to identify their own styles of communication as well as defining and identifying passive, assertive, and aggressive styles of communication. Participants will identify strategies to communicate in a more assertive manner in an effort to appropriately meet their needs. This group will be process-oriented, with patients participating in exploration of their own experiences as well as giving and receiving support and challenge from other group members.   Therapeutic Goals: 1. Patient will identify their personal communication style. 2. Patient will identify passive, assertive, and aggressive forms of communication. 3. Patient will identify strategies for developing more effective communication to appropriately meet their needs.    Summary of Patient Progress: The Pt attended group and remained there the entire time. The Pt accepted all worksheets and materials that were provided.  The Pt was appropriate with their peers and staff.  The Pt demonstrated understanding of the topic being discussed by asking questions and sharing their thoughts and feelings with their peers and staff.      Therapeutic Modalities:  Communication Skills Solution Focused Therapy Motivational Interviewing  Darleen Crocker, [place] 07/31/2022  1:08 PM

## 2022-07-31 NOTE — Progress Notes (Signed)
[facility] MD Progress Note  07/31/2022 1:48 PM Ryan Horne  MRN:  [id number] Principal Problem: Schizoaffective disorder ([place]) Diagnosis: Principal Problem:   Schizoaffective disorder ([place]) Active Problems:   Anxiety   Delta-9-tetrahydrocannabinol (THC) dependence ([place])  Reason for Admission: Ryan Horne is a 22 yo male with past psychiatric history of MDD who presented to the [facility] urgent [facility]) on 03/08 accompanied by his mother with complaints of 'overthinking & SI with a plan of anything quick' as well as VH of his emotions. Patient was assessed and determined to meet criteria for inpatient hospitalization then transferred voluntarily [facility] for further stabilization and treatment.   24 hr chart review: Patient vital signs stabilizing with no elevation over the last 24 hrs. He remains compliant with all scheduled medications; No PRN medications given overnight. He remains visible in the dayroom interacting appropriately with staff and peers, participates in unit group sessions. Patient signed 72 hour form 07/31/22 0925.   Assessment: Patient initially observed in dayroom where he was interacting with other patients and staff. Assessment completed in his room where he presents alert and oriented x3. Calm and cooperative. Bizarre, flat affect. Euthymic Mood. Paucity of speech noted. Thought processes are perseverative and thought content appears hyper-religious with some delusions of grandieur. He reports reason for admission as 'my thoughts and figuring out religion' and goes on to state, 'I think I've figured out religion in my life. I go against how people are taught. I believe Satanism made the Earth and is basically the Bible backwards'. He goes on to report trying acid for the first time about a month ago and has noticed a 'change in my thinking' since. He is currently unemployed and lives with his parents locally where he describes his days  as 'being a bum mostly' laying around the house. He reports adequate sleep and appetite; insisting he 'feels more like a normal person' since his acid ingestion. He endorses ongoing auditory and visual hallucinations that are 'always there' and 'views self as three people Happy, Sad, and Mad'. He reports stopping his outpatient medications x2 years ago which included Abilify and Lexapro; smokes weed regularly as a substitute. He states his reason for signing the 72 hour release was due to 'I don't like being in the same place too long, especially since I have it figured out now'. He denies any active anxiety or depression, SI/HI/AVH at this time. When asked about any recurring thoughts of death patient stated, 'I made up in my mind the next time I have an ideation I'm just going to go ahead and get it over with because I don't think you should keep playing with death' yet he denies any intent, active SI, or plan to harm himself. Patient does appear to have some delusional thoughts; denies any paranoia. He verbalized an understanding of his current medications and denies any side effects at this time. No signs or symptoms of EPS noted during this assessment.   This patient's case was discussed and reviewed with attending MD Ryan Horne with the following recommendations noted: Continue: Abilify 10 mg daily (increased to 10 mg today), Inderal 10 mg twice daily for tachycardia, Fluoxetine 20 mg daily, and continue other medications as listed below.No TD/EPS type symptoms found on assessment, and pt denies any feelings of stiffness. AIMS: 0.   Past Psychiatric History: MDD, schizoaffective disorder, Delta-9 THC dependence, Learning Disorder, Suicidal Ideation  Past Medical History:  Past Medical History:  Diagnosis Date  Anxiety     Past Surgical History:  Procedure Laterality Date   ADENOIDECTOMY  2009   TONSILLECTOMY     Family History:  Family History  Problem Relation Age of Onset   Anxiety disorder  Mother    Depression Mother    Family Psychiatric  History: MDD Social History:  Social History   Substance and Sexual Activity  Alcohol Use Not Currently     Social History   Substance and Sexual Activity  Drug Use Yes   Types: Marijuana    Social History   Socioeconomic History   Marital status: Single    Spouse name: Not on file   Number of children: Not on file   Years of education: 11   Highest education level: 11th grade  Occupational History   Not on file  Tobacco Use   Smoking status: Some Days    Types: Cigarettes   Smokeless tobacco: Never  Vaping Use   Vaping Use: Never used  Substance and Sexual Activity   Alcohol use: Not Currently   Drug use: Yes    Types: Marijuana   Sexual activity: Not Currently  Other Topics Concern   Not on file  Social History Narrative   Not on file   Social Determinants of Health   Financial Resource Strain: Not on file  Food Insecurity: No Food Insecurity (07/27/2022)   Hunger Vital Sign    Worried About Running Out of Food in the Last Year: Never true    Ran Out of Food in the Last Year: Never true  Transportation Needs: No Transportation Needs (07/27/2022)   PRAPARE - Hydrologist (Medical): No    Lack of Transportation (Non-Medical): No  Physical Activity: Not on file  Stress: Not on file  Social Connections: Not on file   Sleep: Fair  Appetite:  Good  Current Medications: Current Facility-Administered Medications  Medication Dose Route Frequency Provider Last Rate Last Admin   acetaminophen (TYLENOL) tablet 650 mg  650 mg Oral Q6H PRN Ryan Georges, NP   650 mg at 07/30/22 1424   alum & mag hydroxide-simeth (MAALOX/MYLANTA) 200-200-20 MG/5ML suspension 30 mL  30 mL Oral Q4H PRN Ryan Georges, NP       ARIPiprazole (ABILIFY) tablet 10 mg  10 mg Oral Daily Nicholes Rough, NP   10 mg at 07/31/22 0736   diphenhydrAMINE (BENADRYL) capsule 50 mg  50 mg Oral TID PRN Ryan Georges, NP        Or   diphenhydrAMINE (BENADRYL) injection 50 mg  50 mg Intramuscular TID PRN Ryan Georges, NP       FLUoxetine (PROZAC) capsule 20 mg  20 mg Oral Daily Nicholes Rough, NP   20 mg at 07/31/22 0736   haloperidol (HALDOL) tablet 5 mg  5 mg Oral TID PRN Ryan Georges, NP       Or   haloperidol lactate (HALDOL) injection 5 mg  5 mg Intramuscular TID PRN Ryan Georges, NP       hydrOXYzine (ATARAX) tablet 25 mg  25 mg Oral Q6H PRN Ajibola, Ene A, NP   25 mg at 07/30/22 0750   LORazepam (ATIVAN) tablet 2 mg  2 mg Oral TID PRN Ryan Georges, NP       Or   LORazepam (ATIVAN) injection 2 mg  2 mg Intramuscular TID PRN Ryan Georges, NP       magnesium hydroxide (MILK OF MAGNESIA) suspension 30 mL  30 mL Oral Daily PRN  Ryan Georges, NP       nicotine polacrilex (NICORETTE) gum 2 mg  2 mg Oral PRN Massengill, Ovid Curd, MD       propranolol (INDERAL) tablet 10 mg  10 mg Oral BID Nicholes Rough, NP   10 mg at 07/31/22 0741   traZODone (DESYREL) tablet 100 mg  100 mg Oral QHS Nicholes Rough, NP   100 mg at 07/30/22 2117    Lab Results:  Results for orders placed or performed during the hospital encounter of 07/27/22 (from the past 48 hour(s))  Basic metabolic panel     Status: None   Collection Time: 07/30/22  6:19 AM  Result Value Ref Range   Sodium 137 135 - 145 mmol/L   Potassium 3.8 3.5 - 5.1 mmol/L   Chloride 103 98 - 111 mmol/L   CO2 27 22 - 32 mmol/L   Glucose, Bld 90 70 - 99 mg/dL    Comment: Glucose reference range applies only to samples taken after fasting for at least 8 hours.   BUN 10 6 - 20 mg/dL   Creatinine, Ser 1.01 0.61 - 1.24 mg/dL   Calcium 9.2 8.9 - 10.3 mg/dL   GFR, Estimated >60 >60 mL/min    Comment: (NOTE) Calculated using the CKD-EPI Creatinine Equation (2021)    Anion gap 7 5 - 15    Comment: Performed at [facility], [place] [address], [place], [place] [address]    Blood Alcohol level:  Lab Results  Component Value Date   ETH <10 [id number]     Metabolic Disorder Labs: Lab Results  Component Value Date   HGBA1C 5.1 07/27/2022   MPG 100 07/27/2022   No results found for: "PROLACTIN" Lab Results  Component Value Date   CHOL 155 07/27/2022   TRIG 91 07/27/2022   HDL 41 07/27/2022   CHOLHDL 3.8 07/27/2022   VLDL 18 07/27/2022   LDLCALC 96 07/27/2022    Physical Findings: AIMS: 0  CIWA: n/a COWS:  0   Musculoskeletal: Strength & Muscle Tone: within normal limits Gait & Station: normal Patient leans: N/A  Psychiatric Specialty Exam:  Presentation  General Appearance:  Bizarre; Casual  Eye Contact: Fair  Speech: Clear and Coherent; Slow  Speech Volume: Normal  Handedness: Right   Mood and Affect  Mood: Euthymic  Affect: Congruent   Thought Process  Thought Processes: Coherent  Descriptions of Associations:Intact  Orientation:Full (Time, Place and Person)  Thought Content:Logical  History of Schizophrenia/Schizoaffective disorder:Yes  Duration of Psychotic Symptoms:Greater than six months  Hallucinations:Hallucinations: Auditory Description of Auditory Hallucinations: 'conversations'  Ideas of Reference:Delusions  Suicidal Thoughts:Suicidal Thoughts: No  Homicidal Thoughts:Homicidal Thoughts: No   Sensorium  Memory: Immediate Fair; Recent Fair  Judgment: Impaired  Insight: Shallow   Executive Functions  Concentration: Fair  Attention Span: Fair  Recall: [employer] of Knowledge: Fair  Language: Fair   Psychomotor Activity  Psychomotor Activity: Psychomotor Activity: Normal   Assets  Assets: Communication Skills; Resilience; Physical Health   Sleep  Sleep: Sleep: Fair    Physical Exam: Physical Exam Constitutional:      Appearance: Normal appearance.  HENT:     Nose: Nose normal.  Eyes:     Pupils: Pupils are equal, round, and reactive to light.  Musculoskeletal:        General: Normal range of motion.  Neurological:     Mental  Status: He is alert and oriented to person, place, and time.     Sensory: No  sensory deficit.  Psychiatric:        Attention and Perception: He perceives auditory hallucinations.        Mood and Affect: Affect is flat.        Behavior: Behavior is slowed.        Thought Content: Thought content is delusional. Thought content does not include homicidal or suicidal ideation. Thought content does not include homicidal or suicidal plan.    Review of Systems  Constitutional: Negative.   HENT: Negative.    Eyes: Negative.   Respiratory: Negative.    Cardiovascular: Negative.   Gastrointestinal:  Negative for heartburn.  Genitourinary: Negative.   Musculoskeletal: Negative.   Skin: Negative.   Neurological:  Negative for dizziness.  Psychiatric/Behavioral:  Positive for depression, hallucinations and substance abuse. Negative for memory loss and suicidal ideas. The patient is nervous/anxious and has insomnia.    Blood pressure 126/79, pulse 87, temperature (!) 97.5 F (36.4 C), temperature source Oral, resp. rate 20, height '5\' 11"'$  (1.803 m), weight 82.6 kg, SpO2 97 %. Body mass index is 25.38 kg/m.   Treatment Plan Summary: Daily contact with patient to assess and evaluate symptoms and progress in treatment Treatment Plan Summary: Daily contact with patient to assess and evaluate symptoms and progress in treatment   Observation Level/Precautions:  15 minute checks  Laboratory:  Labs reviewed   Psychotherapy:  Unit Group sessions  Medications:  See [facility]  Consultations:  To be determined   Discharge Concerns:  Safety, medication compliance, mood stability  Estimated LOS: 5-7 days  Other:  N/A    Labs independently reviewed on 07/30/2022: Tox screen + THC, Resp panel negative, TSH WNL, lipid panel WNL, hemoglobin A1c WNL, potassium was 3.0 on admission, and was replaced in ED with 40 MED.  Rechecked BMP in 10/10 and was 3.2. Given 40 meq x 2 doses,  rechecked again on 10/11, now WNL at  3.8.  CBC reviewed.UDS positive for THC. UA cloudy in appearance otherwise WNL. Baseline EKG with Qtc 398.      PLAN Safety and Monitoring: Voluntary admission to inpatient psychiatric unit for safety, stabilization and treatment Daily contact with patient to assess and evaluate symptoms and progress in treatment Patient's case to be discussed in multi-disciplinary team meeting Observation Level : q15 minute checks Vital signs: q12 hours Precautions: Safety   Long Term Goal(s): Improvement in symptoms so as ready for discharge   Short Term Goals: Ability to identify changes in lifestyle to reduce recurrence of condition will improve, Ability to verbalize feelings will improve, Ability to disclose and discuss suicidal ideas, Ability to identify and develop effective coping behaviors will improve, Ability to maintain clinical measurements within normal limits will improve, Compliance with prescribed medications will improve, and Ability to identify triggers associated with substance abuse/mental health issues will improve   Diagnoses Active Problems:   MDD (major depressive disorder), recurrent, severe, with psychosis ([facility])   Anxiety   Delta-9-tetrahydrocannabinol (THC) dependence ([facility])   Medications  -Continue:  -Inderal 10 mg BID for psychosis -Abilify to 10 mg daily for mood stabilization and psychosis -Given Potassium 40 MEQ x 2 doses on 3/10-completed-K WNL. -Prozac 20 mg daily starting 3/11 for depressive symptoms -trazodone to 100 mg nightly for sleep -nicotine patch daily for nicotine addiction -hydroxyzine 25 mg 3 times daily as needed for anxiety -agitation protocol: Ativan/Haldol/Benadryl-please see MAR for complete order   ANTIPSYCHOTIC CONSENT : We discussed the risks, benefits, side effects, and alternatives to THE PRESCRIBED ANTIPSYCHOTIC, including  but not limited to, the risk of fatigue, sedation, metabolic syndrome, weight gain, movement abnormalities such as tremor  & cogwheeling & tardive dyskinesia, temperature sensitivity, photosensitivity, blood pressure changes, heart rhythm effects, potential for medication interactions, and to not take these medications with alcohol or illicit drugs; informed consent was obtained. We discussed the necessity for routine monitoring including rating scales of abnormal movements, blood work, and ekgs, while the patient is prescribed antipsychotic medication.    No TD/EPS type symptoms found on assessment, and pt denies any feelings of stiffness. AIMS: 0.   Other PRNS -Continue Tylenol 650 mg every 6 hours PRN for mild pain -Continue Maalox 30 mg every 4 hrs PRN for indigestion -Continue Imodium 2-4 mg as needed for diarrhea -Continue Milk of Magnesia as needed every 6 hrs for constipation -Continue Zofran disintegrating tabs every 6 hrs PRN for nausea   Discharge Planning: Social work and case management to assist with discharge planning and identification of hospital follow-up needs prior to discharge Estimated LOS: 5-7 days Discharge Concerns: Need to establish a safety plan; Medication compliance and effectiveness Discharge Goals: Return home with outpatient referrals for mental health follow-up including medication management/psychotherapy   I certify that inpatient services furnished can reasonably be expected to improve the patient's condition.    Inda Merlin, NP 07/31/2022, 1:48 PMPatient ID: Ryan Horne, male   DOB: April 01, 2001, 22 y.o.   MRN: [id number]

## 2022-07-31 NOTE — Progress Notes (Signed)
   07/31/22 0610  15 Minute Checks  Location Dayroom  Visual Appearance Calm  Behavior Composed  Sleep (Behavioral Health Patients Only)  Calculate sleep? (Click Yes once per 24 hr at 0600 safety check) Yes  Documented sleep last 24 hours 4.75

## 2022-07-31 NOTE — Group Note (Signed)
Recreation Therapy Group Note   Group Topic:Animal Assisted Therapy   Group Date: 07/31/2022 Start Time: [id number] End Time: 1033 Facilitators: Jospeh Mangel-McCall, LRT,CTRS Location: [address] Dayroom   Animal-Assisted Activity (AAA) Program Checklist/Progress Notes Patient Eligibility Criteria Checklist & Daily Group note for Rec Tx Intervention  AAA/T Program Assumption of Risk Form signed by Patient/ or Parent Legal Guardian Yes  Patient is free of allergies or severe asthma Yes  Patient reports no fear of animals Yes  Patient reports no history of cruelty to animals Yes  Patient understands his/her participation is voluntary Yes  Patient washes hands before animal contact Yes  Patient washes hands after animal contact Yes   Affect/Mood: Appropriate   Participation Level: Engaged   Participation Quality: Independent   Behavior: Appropriate    Clinical Observations/Individualized Feedback: Patient attended session and interacted appropriately with therapy dog and peers. Patient asked appropriate questions about therapy dog and his training. Patient shared stories about their pets at home with group.     Plan: Continue to engage patient in RT group sessions 2-3x/week.   Lajoya Dombek-McCall, LRT,CTRS 07/31/2022 1:11 PM

## 2022-07-31 NOTE — Progress Notes (Signed)
   07/31/22 2300  Psych Admission Type (Psych Patients Only)  Admission Status Voluntary/72 hour document signed  Date 72 hour document signed  07/31/22  Time 72 hour document signed  0925  Provider Notified (First and Last Name) (see details for LINK to note) Goli, MD  Psychosocial Assessment  Patient Complaints None  Eye Contact Fair  Facial Expression Animated;Flat  Affect Appropriate to circumstance  Speech Logical/coherent  Interaction Assertive  Motor Activity Slow  Appearance/Hygiene Unremarkable  Behavior Characteristics Cooperative;Calm  Mood Pleasant  Thought Process  Coherency WDL  Content WDL  Delusions None reported or observed  Perception WDL  Hallucination None reported or observed  Judgment Impaired  Confusion None  Danger to Self  Current suicidal ideation? Denies  Self-Injurious Behavior No self-injurious ideation or behavior indicators observed or expressed   Agreement Not to Harm Self Yes  Description of Agreement verbal  Danger to Others  Danger to Others None reported or observed

## 2022-07-31 NOTE — Progress Notes (Signed)
The patient rated his day as a 7 out of 10 since he described his day as having been "not bad". His positive event for the day is that he went outside.

## 2022-08-01 NOTE — Progress Notes (Signed)
[facility] MD Progress Note  08/01/2022 3:29 PM Ryan Horne  MRN:  [id number] Principal Problem: Schizoaffective disorder [facility]) Diagnosis: Principal Problem:   Schizoaffective disorder ([place]) Active Problems:   Anxiety   Delta-9-tetrahydrocannabinol (THC) dependence ([place])  Reason for Admission: Ryan Horne is a 22 yo male with past psychiatric history of MDD who presented to the [place] behavioral health urgent care [facility]) on 03/08 accompanied by his mother with complaints of 'overthinking & SI with a plan of anything quick' as well as VH of his emotions. Patient was assessed and determined to meet criteria for inpatient hospitalization then transferred voluntarily [facility] for further stabilization and treatment.   24 hr chart review: Patient vital signs stabilizing with no elevation over the last 24 hrs. He remains compliant with all scheduled medications; No PRN medications given except Nicorette gum for smoking cessation. He remains visible in the dayroom interacting appropriately with staff and peers, participates in unit group sessions. Patient signed 72 hour form 07/31/22 0925.   Assessment: Patient initially observed in dayroom where he was interacting with other patients and staff. Assessment completed in his room where he presents alert and oriented x3. Calm and cooperative. Congruent, brighter affect than day prior. Euthymic Mood. Thought processes linear and thought content logical. He reports feeling 'much better and well rested for the first time in a long time'. He reports staying awake majority of his nights at home and sleeping little during the day. He endorses daily THC-A (synthetic marijuana) use; provider discussed at length health concerns regarding use in which patient verbalize and understanding and plan to stop use. Reports ongoing contact with his mother whom he identifies as one of his major supports. Plan is to return home with his family on discharge.  He denies any active anxiety or depression, SI/HI/AVH at this time. Denies any thoughts of death; endorses 'holding the same belief as far as religion it's just not that serious. I feel like I'm finally coming down from the acid and now possibly the [place]'. No evidence of delusional thinking or paranoid thoughts noted on this assessment. He verbalized an understanding of his current medications and denies any side effects at this time. No signs or symptoms of EPS noted during this assessment.   This patient's case was discussed and reviewed with attending MD Goli no changes to medication plan as noted below. No TD/EPS type symptoms found on assessment, and pt denies any feelings of stiffness. AIMS: 0.   Past Psychiatric History: MDD, schizoaffective disorder, Delta-9 THC dependence, Learning Disorder, Suicidal Ideation  Past Medical History:  Past Medical History:  Diagnosis Date   Anxiety     Past Surgical History:  Procedure Laterality Date   ADENOIDECTOMY  2009   TONSILLECTOMY     Family History:  Family History  Problem Relation Age of Onset   Anxiety disorder Mother    Depression Mother    Family Psychiatric  History: MDD Social History:  Social History   Substance and Sexual Activity  Alcohol Use Not Currently     Social History   Substance and Sexual Activity  Drug Use Yes   Types: Marijuana    Social History   Socioeconomic History   Marital status: Single    Spouse name: Not on file   Number of children: Not on file   Years of education: 11   Highest education level: 11th grade  Occupational History   Not on file  Tobacco Use   Smoking status:  Some Days    Types: Cigarettes   Smokeless tobacco: Never  Vaping Use   Vaping Use: Never used  Substance and Sexual Activity   Alcohol use: Not Currently   Drug use: Yes    Types: Marijuana   Sexual activity: Not Currently  Other Topics Concern   Not on file  Social History Narrative   Not on file   Social  Determinants of Health   Financial Resource Strain: Not on file  Food Insecurity: No Food Insecurity (07/27/2022)   Hunger Vital Sign    Worried About Running Out of Food in the Last Year: Never true    Ran Out of Food in the Last Year: Never true  Transportation Needs: No Transportation Needs (07/27/2022)   PRAPARE - Hydrologist (Medical): No    Lack of Transportation (Non-Medical): No  Physical Activity: Not on file  Stress: Not on file  Social Connections: Not on file   Sleep: Fair  Appetite:  Good  Current Medications: Current Facility-Administered Medications  Medication Dose Route Frequency Provider Last Rate Last Admin   acetaminophen (TYLENOL) tablet 650 mg  650 mg Oral Q6H PRN Evette Georges, NP   650 mg at 07/30/22 1424   alum & mag hydroxide-simeth (MAALOX/MYLANTA) 200-200-20 MG/5ML suspension 30 mL  30 mL Oral Q4H PRN Evette Georges, NP       ARIPiprazole (ABILIFY) tablet 10 mg  10 mg Oral Daily Nicholes Rough, NP   10 mg at 08/01/22 0751   diphenhydrAMINE (BENADRYL) capsule 50 mg  50 mg Oral TID PRN Evette Georges, NP       Or   diphenhydrAMINE (BENADRYL) injection 50 mg  50 mg Intramuscular TID PRN Evette Georges, NP       FLUoxetine (PROZAC) capsule 20 mg  20 mg Oral Daily Nicholes Rough, NP   20 mg at 08/01/22 0751   haloperidol (HALDOL) tablet 5 mg  5 mg Oral TID PRN Evette Georges, NP       Or   haloperidol lactate (HALDOL) injection 5 mg  5 mg Intramuscular TID PRN Evette Georges, NP       hydrOXYzine (ATARAX) tablet 25 mg  25 mg Oral Q6H PRN [place], Ene A, NP   25 mg at 07/30/22 0750   LORazepam (ATIVAN) tablet 2 mg  2 mg Oral TID PRN Evette Georges, NP       Or   LORazepam (ATIVAN) injection 2 mg  2 mg Intramuscular TID PRN Evette Georges, NP       magnesium hydroxide (MILK OF MAGNESIA) suspension 30 mL  30 mL Oral Daily PRN Evette Georges, NP       nicotine polacrilex (NICORETTE) gum 2 mg  2 mg Oral PRN Massengill, Ovid Curd, MD   2 mg at  08/01/22 0753   propranolol (INDERAL) tablet 10 mg  10 mg Oral BID Nicholes Rough, NP   10 mg at 08/01/22 0751   traZODone (DESYREL) tablet 100 mg  100 mg Oral QHS Nicholes Rough, NP   100 mg at 07/31/22 2106    Lab Results:  No results found for this or any previous visit (from the past 48 hour(s)).   Blood Alcohol level:  Lab Results  Component Value Date   ETH <10 [id number]    Metabolic Disorder Labs: Lab Results  Component Value Date   HGBA1C 5.1 07/27/2022   MPG 100 07/27/2022   No results found for: "PROLACTIN" Lab Results  Component Value Date  CHOL 155 07/27/2022   TRIG 91 07/27/2022   HDL 41 07/27/2022   CHOLHDL 3.8 07/27/2022   VLDL 18 07/27/2022   LDLCALC 96 07/27/2022    Physical Findings: AIMS: 0  CIWA: n/a COWS:  0   Musculoskeletal: Strength & Muscle Tone: within normal limits Gait & Station: normal Patient leans: N/A  Psychiatric Specialty Exam:  Presentation  General Appearance:  Casual  Eye Contact: Fair  Speech: Clear and Coherent  Speech Volume: Normal  Handedness: Right   Mood and Affect  Mood: Euthymic  Affect: Congruent   Thought Process  Thought Processes: Linear  Descriptions of Associations:Intact  Orientation:Full (Time, Place and Person)  Thought Content:Logical  History of Schizophrenia/Schizoaffective disorder:Yes  Duration of Psychotic Symptoms:Greater than six months  Hallucinations:Hallucinations: Auditory Description of Auditory Hallucinations: 'conversations'  Ideas of Reference:None  Suicidal Thoughts:Suicidal Thoughts: No  Homicidal Thoughts:Homicidal Thoughts: No   Sensorium  Memory: Immediate Fair; Recent Fair  Judgment: Fair  Insight: Fair   Materials engineer: Fair  Attention Span: Fair  Recall: [employer] of Knowledge: Fair  Language: Fair   Psychomotor Activity  Psychomotor Activity: Psychomotor Activity: Normal   Assets   Assets: Armed forces logistics/support/administrative officer; Physical Health; Resilience; Social Support   Sleep  Sleep: Sleep: Good    Physical Exam: Physical Exam Constitutional:      Appearance: Normal appearance.  HENT:     Nose: Nose normal.  Eyes:     Pupils: Pupils are equal, round, and reactive to light.  Musculoskeletal:        General: Normal range of motion.  Neurological:     Mental Status: He is alert and oriented to person, place, and time.     Sensory: No sensory deficit.  Psychiatric:        Attention and Perception: He perceives auditory hallucinations.        Mood and Affect: Affect is flat.        Behavior: Behavior is slowed.        Thought Content: Thought content is delusional. Thought content does not include homicidal or suicidal ideation. Thought content does not include homicidal or suicidal plan.    Review of Systems  Constitutional: Negative.   HENT: Negative.    Eyes: Negative.   Respiratory: Negative.    Cardiovascular: Negative.   Gastrointestinal:  Negative for heartburn.  Genitourinary: Negative.   Musculoskeletal: Negative.   Skin: Negative.   Neurological:  Negative for dizziness.  Psychiatric/Behavioral:  Positive for depression, hallucinations and substance abuse. Negative for memory loss and suicidal ideas. The patient is nervous/anxious and has insomnia.    Blood pressure 105/71, pulse 97, temperature 97.8 F (36.6 C), temperature source Oral, resp. rate 20, height '5\' 11"'$  (1.803 m), weight 82.6 kg, SpO2 99 %. Body mass index is 25.38 kg/m.   Treatment Plan Summary: Daily contact with patient to assess and evaluate symptoms and progress in treatment Treatment Plan Summary: Daily contact with patient to assess and evaluate symptoms and progress in treatment   Observation Level/Precautions:  15 minute checks  Laboratory:  Labs reviewed   Psychotherapy:  Unit Group sessions  Medications:  See [facility]  Consultations:  To be determined   Discharge Concerns:   Safety, medication compliance, mood stability  Estimated LOS: 5-7 days  Other:  N/A    Labs independently reviewed on 07/30/2022: Tox screen + THC, Resp panel negative, TSH WNL, lipid panel WNL, hemoglobin A1c WNL, potassium was 3.0 on admission, and was replaced in ED  with 40 MED.  Rechecked BMP in 10/10 and was 3.2. Given 40 meq x 2 doses,  rechecked again on 10/11, now WNL at 3.8.  CBC reviewed.UDS positive for THC. UA cloudy in appearance otherwise WNL. Baseline EKG with Qtc 398.      PLAN Safety and Monitoring: Voluntary admission to inpatient psychiatric unit for safety, stabilization and treatment Daily contact with patient to assess and evaluate symptoms and progress in treatment Patient's case to be discussed in multi-disciplinary team meeting Observation Level : q15 minute checks Vital signs: q12 hours Precautions: Safety   Long Term Goal(s): Improvement in symptoms so as ready for discharge   Short Term Goals: Ability to identify changes in lifestyle to reduce recurrence of condition will improve, Ability to verbalize feelings will improve, Ability to disclose and discuss suicidal ideas, Ability to identify and develop effective coping behaviors will improve, Ability to maintain clinical measurements within normal limits will improve, Compliance with prescribed medications will improve, and Ability to identify triggers associated with substance abuse/mental health issues will improve   Diagnoses Active Problems:   MDD (major depressive disorder), recurrent, severe, with psychosis (HCC)   Anxiety   Delta-9-tetrahydrocannabinol (THC) dependence (HCC)   Medications  -Continue:  -Inderal 10 mg BID for psychosis -Abilify to 10 mg daily for mood stabilization and psychosis -Given Potassium 40 MEQ x 2 doses on 3/10-completed-K WNL. -Prozac 20 mg daily starting 3/11 for depressive symptoms -trazodone to 100 mg nightly for sleep -nicotine patch daily for nicotine  addiction -hydroxyzine 25 mg 3 times daily as needed for anxiety -agitation protocol: Ativan/Haldol/Benadryl-please see MAR for complete order   ANTIPSYCHOTIC CONSENT : We discussed the risks, benefits, side effects, and alternatives to THE PRESCRIBED ANTIPSYCHOTIC, including but not limited to, the risk of fatigue, sedation, metabolic syndrome, weight gain, movement abnormalities such as tremor & cogwheeling & tardive dyskinesia, temperature sensitivity, photosensitivity, blood pressure changes, heart rhythm effects, potential for medication interactions, and to not take these medications with alcohol or illicit drugs; informed consent was obtained. We discussed the necessity for routine monitoring including rating scales of abnormal movements, blood work, and ekgs, while the patient is prescribed antipsychotic medication.    No TD/EPS type symptoms found on assessment, and pt denies any feelings of stiffness. AIMS: 0.   Other PRNS -Continue Tylenol 650 mg every 6 hours PRN for mild pain -Continue Maalox 30 mg every 4 hrs PRN for indigestion -Continue Imodium 2-4 mg as needed for diarrhea -Continue Milk of Magnesia as needed every 6 hrs for constipation -Continue Zofran disintegrating tabs every 6 hrs PRN for nausea   Discharge Planning: Social work and case management to assist with discharge planning and identification of hospital follow-up needs prior to discharge Estimated LOS: 5-7 days Discharge Concerns: Need to establish a safety plan; Medication compliance and effectiveness Discharge Goals: Return home with outpatient referrals for mental health follow-up including medication management/psychotherapy   I certify that inpatient services furnished can reasonably be expected to improve the patient's condition.    Inda Merlin, NP 08/01/2022, 3:29 PM  Patient ID: Ryan Horne, male   DOB: 06-06-2000, 22 y.o.   MRN: [id number]

## 2022-08-01 NOTE — Progress Notes (Signed)
   08/01/22 2300  Psych Admission Type (Psych Patients Only)  Admission Status Voluntary/72 hour document signed  Date 72 hour document signed  07/31/22  Time 72 hour document signed  0925  Provider Notified (First and Last Name) (see details for LINK to note) Goli, MD  Psychosocial Assessment  Patient Complaints None  Eye Contact Fair  Facial Expression Flat;Animated  Affect Appropriate to circumstance  Scientific laboratory technician Assertive  Motor Activity Slow  Appearance/Hygiene Unremarkable  Behavior Characteristics Cooperative;Appropriate to situation  Mood Pleasant  Thought Horticulturist, commercial of ideas  Content Blaming self  Delusions Religious  Perception WDL  Hallucination None reported or observed  Judgment Impaired  Confusion None  Danger to Self  Current suicidal ideation? Denies  Self-Injurious Behavior No self-injurious ideation or behavior indicators observed or expressed   Agreement Not to Harm Self Yes  Description of Agreement verbal  Danger to Others  Danger to Others None reported or observed

## 2022-08-01 NOTE — Progress Notes (Signed)
   08/01/22 0615  15 Minute Checks  Location Bedroom  Visual Appearance Calm  Behavior Composed  Sleep (Behavioral Health Patients Only)  Calculate sleep? (Click Yes once per 24 hr at 0600 safety check) Yes  Documented sleep last 24 hours 6.25

## 2022-08-01 NOTE — BHH Group Notes (Signed)
[place] Group Notes:  (Nursing/MHT/Case Management/Adjunct)  Date:  08/01/2022  Time:  9:21 AM  Type of Therapy:  Group Topic/ Focus: Goals Group: The focus of this group is to help patients establish daily goals to achieve during treatment and discuss how the patient can incorporate goal setting into their daily lives to aide in recovery.   Participation Level:  Active  Participation Quality:  Appropriate  Affect:  Appropriate  Cognitive:  Appropriate  Insight:  Appropriate  Engagement in Group:  Engaged  Modes of Intervention:  Discussion  Summary of Progress/Problems:  Patient attended and participated in orientation/ goals group today. Patient's goal for today is to try to talk more and be more positive about himself.   Elza Rafter 08/01/2022, 9:21 AM

## 2022-08-01 NOTE — Progress Notes (Signed)
Pt denied SI/HI/AVH this morning. Pt given scheduled medications as prescribed. Q15 min checks verified for safety. Patient compliant with medications and treatment plan. Patient is interacting well on the unit and participating in groups. Pt is safe on the unit.   08/01/22 0900  Psych Admission Type (Psych Patients Only)  Admission Status Voluntary/72 hour document signed  Date 72 hour document signed  07/31/22  Time 72 hour document signed  0925  Provider Notified (First and Last Name) (see details for LINK to note) Goli, MD  Psychosocial Assessment  Patient Complaints None  Eye Contact Fair  Facial Expression Flat  Affect Appropriate to circumstance  Speech Logical/coherent  Interaction Assertive  Motor Activity Slow  Appearance/Hygiene Unremarkable  Behavior Characteristics Cooperative;Appropriate to situation  Mood Pleasant  Thought Process  Coherency WDL  Content WDL  Delusions None reported or observed  Perception WDL  Hallucination None reported or observed  Judgment Impaired  Confusion None  Danger to Self  Current suicidal ideation? Denies  Self-Injurious Behavior No self-injurious ideation or behavior indicators observed or expressed   Agreement Not to Harm Self Yes  Description of Agreement Verbal  Danger to Others  Danger to Others None reported or observed

## 2022-08-02 DIAGNOSIS — F251 Schizoaffective disorder, depressive type: Secondary | ICD-10-CM

## 2022-08-02 NOTE — Plan of Care (Signed)
  Problem: Activity: Goal: Interest or engagement in activities will improve Outcome: Progressing   Problem: Coping: Goal: Ability to verbalize frustrations and anger appropriately will improve Outcome: Progressing   Problem: Safety: Goal: Periods of time without injury will increase Outcome: Progressing   

## 2022-08-02 NOTE — Progress Notes (Signed)
  [facility] Adult Case Management Discharge Plan :  Will you be returning to the same living situation after discharge:  Yes,  Patient will be returning back with his mom  At discharge, do you have transportation home?: Yes,  Patient mom will be picking him up  Do you have the ability to pay for your medications: Yes,  Patient has LME Medicaid [employer]   Release of information consent forms completed and in the chart;  Patient's signature needed at discharge.  Patient to Follow up at:  Follow-up Information     [facility]. Go on 08/13/2022.   Why: You have a hospital follow up appointment for therapy and medication management services on 08/13/22 at 12:30 pm.  The appointment will be held in person. Contact information: [address] [place]  [id number] [phone]                 Next level of care provider has access to [place] and Suicide Prevention discussed: Yes,  Izell Labat, mother, [phone]     Has patient been referred to the Quitline?: Patient refused referral  Patient has been referred for addiction treatment: N/A. Patient only uses cannabis [facility])  Sherre Lain, LCSWA 08/02/2022, 3:07 PM

## 2022-08-02 NOTE — Group Note (Signed)
LCSW Group Therapy Note   Group Date: 08/02/2022 Start Time: 1100 End Time: 1200  Type of Group and Topic: Psychoeducational Group: Discharge Planning  Participation Level: Active  Description of Group Discharge planning group reviews patient's anticipated discharge plans and assists patients to anticipate and address any barriers to wellness/recovery in the community. Suicide prevention education is reviewed with patients in group.  Therapeutic Goals 1. Patients will state their anticipated discharge plan and mental health aftercare 2. Patients will identify potential barriers to wellness in the community setting 3. Patients will engage in problem solving, solution focused discussion of ways to anticipate and address barriers to wellness/recovery  Summary of Patient Progress: The Pt attended group and remained there the entire time.  The Pt accepted all worksheets and materials provided.  The Pt was appropriate with all peers and staff.  The Pt demonstrated understanding of the topic being discussed by sharing their thoughts and feeling about discharge and sharing community resources with their peers.    Darleen Crocker, LCSWA 08/02/2022  1:35 PM

## 2022-08-02 NOTE — Progress Notes (Signed)
   08/02/22 0641  15 Minute Checks  Location Hallway  Visual Appearance Calm  Behavior Composed  Sleep (Behavioral Health Patients Only)  Calculate sleep? (Click Yes once per 24 hr at 0600 safety check) Yes  Documented sleep last 24 hours 6.75

## 2022-08-02 NOTE — Progress Notes (Signed)
Patient ID: Ryan Horne, male   DOB: [birth year], 22 y.o.   MRN: [id number]   Pt withdrew his 72-hour Request for Discharge today, 08/02/22, at 0823. Goli, MD and treatment team notified.

## 2022-08-02 NOTE — Group Note (Signed)
Date:  08/02/2022 Time:  10:46 PM  Group Topic/Focus:  Wrap-Up Group:   The focus of this group is to help patients review their daily goal of treatment and discuss progress on daily workbooks.    Participation Level:  Active  Participation Quality:  Appropriate and Attentive  Affect:  Appropriate  Cognitive:  Alert and Appropriate  Insight: Appropriate and Good  Engagement in Group:  Engaged  Modes of Intervention:  Discussion and Education  Additional Comments:  Pt attended and participated in wrap up group this evening. In this group we discussed the patient's day, their goals and progress while in the facility.   Cristi Loron 08/02/2022, 10:46 PM

## 2022-08-02 NOTE — Progress Notes (Signed)
   08/02/22 0823  Psych Admission Type (Psych Patients Only)  Admission Status Other (Comment) (Request withdrawn by patient)  Date 17 hour document signed  08/02/22  Time 72 hour document signed  0823  Provider Notified (First and Last Name) (see details for LINK to note) Goli, MD  Psychosocial Assessment  Patient Complaints None  Eye Contact Fair  Facial Expression Flat  Affect Appropriate to circumstance  Speech Logical/coherent  Interaction Assertive  Motor Activity Other (Comment) (WNL)  Appearance/Hygiene Unremarkable  Behavior Characteristics Cooperative;Appropriate to situation  Mood Pleasant  Thought Horticulturist, commercial of ideas  Content Blaming self  Delusions Religious  Perception WDL  Hallucination None reported or observed  Judgment Impaired  Confusion None  Danger to Self  Current suicidal ideation? Denies  Self-Injurious Behavior No self-injurious ideation or behavior indicators observed or expressed   Agreement Not to Harm Self Yes  Description of Agreement verbal  Danger to Others  Danger to Others None reported or observed

## 2022-08-02 NOTE — Progress Notes (Signed)
[facility] MD Progress Note  08/02/2022 2:06 PM Ryan Horne  MRN:  [id number] Principal Problem: Schizoaffective disorder [facility]) Diagnosis: Principal Problem:   Schizoaffective disorder ([place]) Active Problems:   Anxiety   Delta-9-tetrahydrocannabinol (THC) dependence ([place])  Reason for Admission: Ryan Horne is a 22 yo male with past psychiatric history of MDD who presented to the [place] behavioral health urgent care [facility]) on 03/08 accompanied by his mother with complaints of 'overthinking & SI with a plan of anything quick' as well as VH of his emotions. Patient was assessed and determined to meet criteria for inpatient hospitalization then transferred voluntarily [facility] for further stabilization and treatment.   24 hr chart review: Staff reports that the patient has been fairly stable with no significant behavioral problems and no acute as needed medications than nicotine gum.  He has been compliant with groups and has been actively interacting with staff and peers and participating in unit group sessions. The patient has retracted the 72-hour request for discharge.  Assessment:  The patient was seen and evaluated and the case was discussed with the treatment team.  He remains euthymic and oriented x 3 and cooperative.  He reports that he feels much better and has learned a lot during the hospitalization.  He also acknowledges the fact that he is able to interact in social situations without feeling highly anxious.  He states that this is the first time he has talked to so many people in his entire life.  He feels better overall.  She did have problems with daily use of THC-A and understands that that may have a contributing factor to his paranoia and psychosis.  He has been no evidence of delusional thinking in the last 24 hours.  After discussing with him, he was willing to retract his notice to enable [place] to do a normal discharge on Friday.  No TD and EPS was noted he  denies any stiffness or restlessness.  He is cognitively intact.  The plan is to consider discharge with a safety plan on Friday.  Past Psychiatric History: MDD, schizoaffective disorder, Delta-9 THC dependence, Learning Disorder, Suicidal Ideation  Past Medical History:  Past Medical History:  Diagnosis Date   Anxiety     Past Surgical History:  Procedure Laterality Date   ADENOIDECTOMY  2009   TONSILLECTOMY     Family History:  Family History  Problem Relation Age of Onset   Anxiety disorder Mother    Depression Mother    Family Psychiatric  History: MDD Social History:  Social History   Substance and Sexual Activity  Alcohol Use Not Currently     Social History   Substance and Sexual Activity  Drug Use Yes   Types: Marijuana    Social History   Socioeconomic History   Marital status: Single    Spouse name: Not on file   Number of children: Not on file   Years of education: 11   Highest education level: 11th grade  Occupational History   Not on file  Tobacco Use   Smoking status: Some Days    Types: Cigarettes   Smokeless tobacco: Never  Vaping Use   Vaping Use: Never used  Substance and Sexual Activity   Alcohol use: Not Currently   Drug use: Yes    Types: Marijuana   Sexual activity: Not Currently  Other Topics Concern   Not on file  Social History Narrative   Not on file   Social Determinants of  Health   Financial Resource Strain: Not on file  Food Insecurity: No Food Insecurity (07/27/2022)   Hunger Vital Sign    Worried About Running Out of Food in the Last Year: Never true    Ran Out of Food in the Last Year: Never true  Transportation Needs: No Transportation Needs (07/27/2022)   PRAPARE - Hydrologist (Medical): No    Lack of Transportation (Non-Medical): No  Physical Activity: Not on file  Stress: Not on file  Social Connections: Not on file   Sleep: Fair  Appetite:  Good  Current Medications: Current  Facility-Administered Medications  Medication Dose Route Frequency Provider Last Rate Last Admin   acetaminophen (TYLENOL) tablet 650 mg  650 mg Oral Q6H PRN Evette Georges, NP   650 mg at 07/30/22 1424   alum & mag hydroxide-simeth (MAALOX/MYLANTA) 200-200-20 MG/5ML suspension 30 mL  30 mL Oral Q4H PRN Evette Georges, NP       ARIPiprazole (ABILIFY) tablet 10 mg  10 mg Oral Daily Nicholes Rough, NP   10 mg at 08/02/22 [id number]   diphenhydrAMINE (BENADRYL) capsule 50 mg  50 mg Oral TID PRN Evette Georges, NP       Or   diphenhydrAMINE (BENADRYL) injection 50 mg  50 mg Intramuscular TID PRN Evette Georges, NP       FLUoxetine (PROZAC) capsule 20 mg  20 mg Oral Daily Nicholes Rough, NP   20 mg at 08/02/22 [id number]   haloperidol (HALDOL) tablet 5 mg  5 mg Oral TID PRN Evette Georges, NP       Or   haloperidol lactate (HALDOL) injection 5 mg  5 mg Intramuscular TID PRN Evette Georges, NP       hydrOXYzine (ATARAX) tablet 25 mg  25 mg Oral Q6H PRN [place], Ene A, NP   25 mg at 07/30/22 0750   LORazepam (ATIVAN) tablet 2 mg  2 mg Oral TID PRN Evette Georges, NP       Or   LORazepam (ATIVAN) injection 2 mg  2 mg Intramuscular TID PRN Evette Georges, NP       magnesium hydroxide (MILK OF MAGNESIA) suspension 30 mL  30 mL Oral Daily PRN Evette Georges, NP       nicotine polacrilex (NICORETTE) gum 2 mg  2 mg Oral PRN Massengill, Ovid Curd, MD   2 mg at 08/02/22 1249   propranolol (INDERAL) tablet 10 mg  10 mg Oral BID Nicholes Rough, NP   10 mg at 08/02/22 [id number]   traZODone (DESYREL) tablet 100 mg  100 mg Oral QHS Nicholes Rough, NP   100 mg at 08/01/22 2114    Lab Results:  No results found for this or any previous visit (from the past 48 hour(s)).   Blood Alcohol level:  Lab Results  Component Value Date   ETH <10 [id number]    Metabolic Disorder Labs: Lab Results  Component Value Date   HGBA1C 5.1 07/27/2022   MPG 100 07/27/2022   No results found for: "PROLACTIN" Lab Results  Component Value Date    CHOL 155 07/27/2022   TRIG 91 07/27/2022   HDL 41 07/27/2022   CHOLHDL 3.8 07/27/2022   VLDL 18 07/27/2022   LDLCALC 96 07/27/2022    Physical Findings: AIMS: 0  CIWA: n/a COWS:  0   Musculoskeletal: Strength & Muscle Tone: within normal limits Gait & Station: normal Patient leans: N/A  Psychiatric Specialty Exam:  Presentation  General Appearance:  Appropriate for  Environment  Eye Contact: Fair  Speech: Clear and Coherent  Speech Volume: Normal  Handedness: Right   Mood and Affect  Mood: Anxious  Affect: Constricted   Thought Process  Thought Processes: Goal Directed  Descriptions of Associations:Intact  Orientation:Full (Time, Place and Person)  Thought Content:Logical  History of Schizophrenia/Schizoaffective disorder:Yes  Duration of Psychotic Symptoms:Greater than six months  Hallucinations:Hallucinations: None  Ideas of Reference:None  Suicidal Thoughts:Suicidal Thoughts: No  Homicidal Thoughts:Homicidal Thoughts: No   Sensorium  Memory: Immediate Fair; Remote Fair; Recent Fair  Judgment: Fair  Insight: Fair   Community education officer  Concentration: Fair  Attention Span: Fair  Recall: [employer] of Knowledge: Fair  Language: Fair   Psychomotor Activity  Psychomotor Activity: Psychomotor Activity: Normal   Assets  Assets: Communication Skills; Social Support   Sleep  Sleep: Sleep: Fair    Physical Exam: Physical Exam Constitutional:      Appearance: Normal appearance.  HENT:     Nose: Nose normal.  Eyes:     Pupils: Pupils are equal, round, and reactive to light.  Musculoskeletal:        General: Normal range of motion.  Neurological:     Mental Status: He is alert and oriented to person, place, and time.     Sensory: No sensory deficit.  Psychiatric:        Attention and Perception: He perceives auditory hallucinations.        Mood and Affect: Affect is flat.        Behavior: Behavior is  slowed.        Thought Content: Thought content is delusional. Thought content does not include homicidal or suicidal ideation. Thought content does not include homicidal or suicidal plan.    Review of Systems  Constitutional: Negative.   HENT: Negative.    Eyes: Negative.   Respiratory: Negative.    Cardiovascular: Negative.   Gastrointestinal:  Negative for heartburn.  Genitourinary: Negative.   Musculoskeletal: Negative.   Skin: Negative.   Neurological:  Negative for dizziness.  Psychiatric/Behavioral:  Negative for memory loss and suicidal ideas.    Blood pressure 122/85, pulse 92, temperature 97.8 F (36.6 C), temperature source Oral, resp. rate 16, height '5\' 11"'$  (1.803 m), weight 82.6 kg, SpO2 98 %. Body mass index is 25.38 kg/m.   Treatment Plan Summary: Daily contact with patient to assess and evaluate symptoms and progress in treatment Treatment Plan Summary: Daily contact with patient to assess and evaluate symptoms and progress in treatment   Observation Level/Precautions:  15 minute checks  Laboratory:  Labs reviewed   Psychotherapy:  Unit Group sessions  Medications:  See [facility]  Consultations:  To be determined   Discharge Concerns:  Safety, medication compliance, mood stability  Estimated LOS: 5-7 days  Other:  N/A    Labs independently reviewed on 07/30/2022: Tox screen + THC, Resp panel negative, TSH WNL, lipid panel WNL, hemoglobin A1c WNL, potassium was 3.0 on admission, and was replaced in ED with 40 MED.  Rechecked BMP in 10/10 and was 3.2. Given 40 meq x 2 doses,  rechecked again on 10/11, now WNL at 3.8.  CBC reviewed.UDS positive for THC. UA cloudy in appearance otherwise WNL. Baseline EKG with Qtc 398.      PLAN Safety and Monitoring: Voluntary admission to inpatient psychiatric unit for safety, stabilization and treatment Daily contact with patient to assess and evaluate symptoms and progress in treatment Patient's case to be discussed in  multi-disciplinary team meeting Observation Level :  q15 minute checks Vital signs: q12 hours Precautions: Safety   Long Term Goal(s): Improvement in symptoms so as ready for discharge   Short Term Goals: Ability to identify changes in lifestyle to reduce recurrence of condition will improve, Ability to verbalize feelings will improve, Ability to disclose and discuss suicidal ideas, Ability to identify and develop effective coping behaviors will improve, Ability to maintain clinical measurements within normal limits will improve, Compliance with prescribed medications will improve, and Ability to identify triggers associated with substance abuse/mental health issues will improve   Diagnoses Active Problems:   MDD (major depressive disorder), recurrent, severe, with psychosis (H[facility])   Anxiety   Delta-9-tetrahydrocannabinol (THC) dependence ([facility])   Medications  -Continue:  -Inderal 10 mg BID for psychosis -Abilify to 10 mg daily for mood stabilization and psychosis -Given Potassium 40 MEQ x 2 doses on 3/10-completed-K WNL. -Prozac 20 mg daily starting 3/11 for depressive symptoms -trazodone to 100 mg nightly for sleep -nicotine patch daily for nicotine addiction -hydroxyzine 25 mg 3 times daily as needed for anxiety -agitation protocol: Ativan/Haldol/Benadryl-please see MAR for complete order   ANTIPSYCHOTIC CONSENT : We discussed the risks, benefits, side effects, and alternatives to THE PRESCRIBED ANTIPSYCHOTIC, including but not limited to, the risk of fatigue, sedation, metabolic syndrome, weight gain, movement abnormalities such as tremor & cogwheeling & tardive dyskinesia, temperature sensitivity, photosensitivity, blood pressure changes, heart rhythm effects, potential for medication interactions, and to not take these medications with alcohol or illicit drugs; informed consent was obtained. We discussed the necessity for routine monitoring including rating scales of abnormal movements,  blood work, and ekgs, while the patient is prescribed antipsychotic medication.    No TD/EPS type symptoms found on assessment, and pt denies any feelings of stiffness. AIMS: 0.   Other PRNS -Continue Tylenol 650 mg every 6 hours PRN for mild pain -Continue Maalox 30 mg every 4 hrs PRN for indigestion -Continue Imodium 2-4 mg as needed for diarrhea -Continue Milk of Magnesia as needed every 6 hrs for constipation -Continue Zofran disintegrating tabs every 6 hrs PRN for nausea   Discharge Planning: Social work and case management to assist with discharge planning and identification of hospital follow-up needs prior to discharge Estimated LOS: Discharge on Friday, 08/03/2022 Discharge Concerns: Need to establish a safety plan; Medication compliance and effectiveness Discharge Goals: Return home with outpatient referrals for mental health follow-up including medication management/psychotherapy   I certify that inpatient services furnished can reasonably be expected to improve the patient's condition.    Ranae Palms, MD 08/02/2022, 2:06 PM  Patient ID: Ryan Horne, male   DOB: 2001-03-07, 22 y.o.   MRN: [id number] Patient ID: Ryan Horne, male   DOB: February 25, 2001, 22 y.o.   MRN: [id number] Total Time Spent in Direct Patient Care:  I personally spent 30 minutes on the unit in direct patient care. The direct patient care time included face-to-face time with the patient, reviewing the patient's chart, communicating with other professionals, and coordinating care. Greater than 50% of this time was spent in counseling or coordinating care with the patient regarding goals of hospitalization, psycho-education, and discharge planning needs.   [place] [facility]rist

## 2022-08-03 MED ORDER — ARIPIPRAZOLE 10 MG PO TABS: 10.0000 mg | ORAL_TABLET | Freq: Every day | ORAL | 0 refills | Status: AC

## 2022-08-03 MED ORDER — TRAZODONE HCL 100 MG PO TABS: 100.0000 mg | ORAL_TABLET | Freq: Every day | ORAL | 0 refills | Status: AC

## 2022-08-03 MED ORDER — HYDROXYZINE HCL 25 MG PO TABS: 25.0000 mg | ORAL_TABLET | Freq: Four times a day (QID) | ORAL | 0 refills | Status: AC | PRN

## 2022-08-03 MED ORDER — FLUOXETINE HCL 20 MG PO CAPS: 20.0000 mg | ORAL_CAPSULE | Freq: Every day | ORAL | 3 refills | Status: AC

## 2022-08-03 MED ORDER — PROPRANOLOL HCL 10 MG PO TABS: 10.0000 mg | ORAL_TABLET | Freq: Two times a day (BID) | ORAL | 0 refills | Status: AC

## 2022-08-03 NOTE — Progress Notes (Signed)
   08/03/22 0557  15 Minute Checks  Location Bedroom  Visual Appearance Calm  Behavior Sleeping  Sleep (Behavioral Health Patients Only)  Calculate sleep? (Click Yes once per 24 hr at 0600 safety check) Yes  Documented sleep last 24 hours 7.75

## 2022-08-03 NOTE — Progress Notes (Signed)
   08/02/22 2300  Psych Admission Type (Psych Patients Only)  Admission Status Voluntary  Psychosocial Assessment  Patient Complaints None  Eye Contact Fair  Facial Expression Flat;Animated  Affect Appropriate to circumstance  Speech Logical/coherent  Interaction Arrogant  Motor Activity Other (Comment) (WNL)  Appearance/Hygiene Unremarkable  Behavior Characteristics Cooperative;Appropriate to situation  Mood Pleasant  Thought Horticulturist, commercial of ideas  Content Blaming self  Delusions Religious  Perception WDL  Hallucination None reported or observed  Judgment Impaired  Confusion None  Danger to Self  Current suicidal ideation? Denies  Self-Injurious Behavior No self-injurious ideation or behavior indicators observed or expressed   Agreement Not to Harm Self Yes  Description of Agreement verbal  Danger to Others  Danger to Others None reported or observed

## 2022-08-03 NOTE — BHH Suicide Risk Assessment (Signed)
Suicide Risk Assessment  Discharge Assessment    [facility] Discharge Suicide Risk Assessment   Principal Problem: Schizoaffective disorder [facility]) Discharge Diagnoses: Principal Problem:   Schizoaffective disorder ([place]) Active Problems:   Anxiety   Delta-9-tetrahydrocannabinol (THC) dependence ([place])  RAYDER CREESE was admitted for Schizoaffective disorder ([place]) and crisis management.  He was treated with the following medications see MAR.  DANO SARABIA was discharged with current medication and was instructed on how to take medications as prescribed; (details listed below under Medication List).  Medical problems were identified and treated as needed.  Home medications were restarted as appropriate.  Improvement was monitored by observation and Charlane Ferretti daily report of symptom reduction.  Emotional and mental status was monitored by daily self-inventory reports completed by Charlane Ferretti and clinical staff.         LOWELL ORDOYNE was evaluated by the treatment team for stability and plans for continued recovery upon discharge.  MYKE MAMMONE motivation was an integral factor for scheduling further treatment.  Employment, transportation, bed availability, health status, family support, and any pending legal issues were also considered during his hospital stay.  He was offered further treatment options upon discharge including but not limited to Residential, Intensive Outpatient, and Outpatient treatment.  RICHMOND BOUNDS will follow up with the services as listed below under Follow Up Information.     Upon completion of this admission the Dru M Chavis was both mentally and medically stable for discharge denying suicidal/homicidal ideation, auditory/visual/tactile hallucinations, delusional thoughts and paranoia.     Total Time spent with patient: 45 minutes  Musculoskeletal: Strength & Muscle Tone: within normal limits Gait & Station: normal Patient leans: N/A  Psychiatric Specialty  Exam  Presentation  General Appearance:  Casual; Appropriate for Environment  Eye Contact: Fair  Speech: Clear and Coherent  Speech Volume: Normal  Handedness: Right   Mood and Affect  Mood: Euthymic  Duration of Depression Symptoms: Greater than two weeks  Affect: Appropriate   Thought Process  Thought Processes: Goal Directed  Descriptions of Associations:Intact  Orientation:Full (Time, Place and Person)  Thought Content:Logical  History of Schizophrenia/Schizoaffective disorder:Yes  Duration of Psychotic Symptoms:Greater than six months  Hallucinations:Hallucinations: None  Ideas of Reference:None  Suicidal Thoughts:Suicidal Thoughts: No  Homicidal Thoughts:Homicidal Thoughts: No   Sensorium  Memory: Immediate Fair; Recent Fair  Judgment: Fair  Insight: Fair   Community education officer  Concentration: Fair  Attention Span: Fair  Recall: [employer] of Knowledge: Fair  Language: Fair   Psychomotor Activity  Psychomotor Activity: Psychomotor Activity: Normal   Assets  Assets: Armed forces logistics/support/administrative officer; Housing; Physical Health; Resilience; Social Support   Sleep  Sleep: Sleep: Good   Physical Exam: Physical Exam Vitals and nursing note reviewed.  Constitutional:      Appearance: He is normal weight. He is not ill-appearing.  Neurological:     Mental Status: He is alert and oriented to person, place, and time.  Psychiatric:        Attention and Perception: He is attentive. He does not perceive auditory or visual hallucinations.        Mood and Affect: Mood normal.        Speech: Speech normal.        Behavior: Behavior is cooperative.        Thought Content: Thought content is not paranoid or delusional. Thought content does not include homicidal or suicidal ideation. Thought content does not include homicidal or suicidal plan.  Review of Systems  Psychiatric/Behavioral:  Negative for depression and suicidal ideas.     Blood pressure 118/68, pulse 80, temperature 97.8 F (36.6 C), temperature source Oral, resp. rate 16, height 5\' 11"  (1.803 m), weight 82.6 kg, SpO2 99 %. Body mass index is 25.38 kg/m.  Mental Status Per Nursing Assessment::   On Admission:  Suicidal ideation indicated by others, Suicidal ideation indicated by patient, Self-harm thoughts  Demographic Factors:  Male, Adolescent or young adult, and Low socioeconomic status  Loss Factors: Decrease in vocational status  Historical Factors: Family history of mental illness or substance abuse and Impulsivity  Risk Reduction Factors:   Sense of responsibility to family, Living with another person, especially a relative, and Positive social support  Continued Clinical Symptoms:  Alcohol/Substance Abuse/Dependencies Schizophrenia:   Less than 83 years old More than one psychiatric diagnosis  Cognitive Features That Contribute To Risk:  Thought constriction (tunnel vision)    Suicide Risk:  Moderate:  Frequent suicidal ideation with limited intensity, and duration, some specificity in terms of plans, no associated intent, good self-control, limited dysphoria/symptomatology, some risk factors present, and identifiable protective factors, including available and accessible social support.   Follow-up Information     [facility] [place]. Go on 08/13/2022.   Why: You have a hospital follow up appointment for therapy and medication management services on 08/13/22 at 12:30 pm.  The appointment will be held in person. Contact information: [address] [place] [address] [phone]                 Plan Of Care/Follow-up recommendations:  Activity: As tolerated  Diet: Heart healthy  Other: -Follow-up with your outpatient psychiatric provider -instructions on appointment date, time, and address (location) are provided to you in discharge paperwork.   -Take your psychiatric medications as prescribed at  discharge - instructions are provided to you in the discharge paperwork.    -Follow-up with outpatient primary care doctor and other specialists -for management of preventative medicine and chronic medical disease, including:   -Testing: Follow-up with outpatient provider for abnormal lab results:  none   -Recommend abstinence from alcohol, tobacco, and other illicit drug use at discharge.    -If your psychiatric symptoms recur, worsen, or if you have side effects to your psychiatric medications, call your outpatient psychiatric provider, 911, [phone] or go to the nearest emergency department.   -If suicidal thoughts recur, call your outpatient psychiatric provider, 911, [phone] or go to the nearest emergency department.  Inda Merlin, NP 08/03/2022, 10:06 AM

## 2022-08-03 NOTE — Discharge Summary (Signed)
Physician Discharge Summary Note  Patient:  Ryan Horne is an 22 y.o., male MRN:  [id number] DOB:  03-08-2001 Patient phone:  [phone] (home)  Patient address:   [place] [address],  Total Time spent with patient: 45 minutes  Date of Admission:  07/27/2022 Date of Discharge: 08/03/2022  Reason for Admission: Ryan Horne is a 22 yo male with past psychiatric history of MDD who presented to the [facility] urgent care [facility]) on 03/08 accompanied by his mother with complaints of 'overthinking & SI with a plan of anything quick' as well as VH of his emotions. Patient was assessed and determined to meet criteria for inpatient hospitalization then transferred voluntarily [facility] for further stabilization and treatment.    24 hr chart review: Patient vital signs stabilizing with no elevation over the last 24 hrs. He remains compliant with all scheduled medications; No PRN medications given except Nicorette gum for smoking cessation. He remains visible in the dayroom interacting appropriately with staff and peers, participates in unit group sessions. Patient signed 72 hour form 07/31/22 0925, plan for discharge today 08/03/22.    Assessment: Patient initially observed in dayroom where he was interacting with other patients and staff. Assessment completed in his room where he presents alert and oriented x3. Calm and cooperative. Congruent, brighter affect than day prior. Euthymic Mood. Thought processes concrete at times but linear and logical. He reports feeling 'much better and well rested. He was slow to recall month stating, "I never really learned that, just really days of the week"; reports history of learning disability. He reports improved mood. Stable appetite. Sleeping throughout the night. Plan for discharge home to family today. He reports plan to remain abstinent from marijuana and THC-a; no interest in stopping cigarettes. He  denies any SI/HI/AVH, paranoia, and there is no evidence of delusional thoughts. He remains visible in the milieu participating in unit groups and activities. He reports daily showering, appropriate grooming. Contracts for safety.    Principal Problem: Schizoaffective disorder [facility]) Discharge Diagnoses: Principal Problem:   Schizoaffective disorder ([place]) Active Problems:   Anxiety   Delta-9-tetrahydrocannabinol (THC) dependence ([facility])   Past Psychiatric History: See H&P  Past Medical History:  Past Medical History:  Diagnosis Date   Anxiety     Past Surgical History:  Procedure Laterality Date   ADENOIDECTOMY  2009   TONSILLECTOMY     Family History:  Family History  Problem Relation Age of Onset   Anxiety disorder Mother    Depression Mother    Family Psychiatric  History: See H&P Social History:  Social History   Substance and Sexual Activity  Alcohol Use Not Currently     Social History   Substance and Sexual Activity  Drug Use Yes   Types: Marijuana    Social History   Socioeconomic History   Marital status: Single    Spouse name: Not on file   Number of children: Not on file   Years of education: 11   Highest education level: 11th grade  Occupational History   Not on file  Tobacco Use   Smoking status: Some Days    Types: Cigarettes   Smokeless tobacco: Never  Vaping Use   Vaping Use: Never used  Substance and Sexual Activity   Alcohol use: Not Currently   Drug use: Yes    Types: Marijuana   Sexual activity: Not Currently  Other Topics Concern   Not on file  Social History  Narrative   Not on file   Social Determinants of Health   Financial Resource Strain: Not on file  Food Insecurity: No Food Insecurity (07/27/2022)   Hunger Vital Sign    Worried About Running Out of Food in the Last Year: Never true    Ran Out of Food in the Last Year: Never true  Transportation Needs: No Transportation Needs (07/27/2022)   PRAPARE - Armed forces logistics/support/administrative officer (Medical): No    Lack of Transportation (Non-Medical): No  Physical Activity: Not on file  Stress: Not on file  Social Connections: Not on file    Hospital Course:   Ryan Horne was admitted for Schizoaffective disorder [facility]) and crisis management.  He was treated with the following medications see MAR.  UYLESS SAFRON was discharged with current medication and was instructed on how to take medications as prescribed; (details listed below under Medication List).  Medical problems were identified and treated as needed.  Home medications were restarted as appropriate.  Improvement was monitored by observation and Ryan Horne daily report of symptom reduction.  Emotional and mental status was monitored by daily self-inventory reports completed by Ryan Horne and clinical staff.         Ryan Horne was evaluated by the treatment team for stability and plans for continued recovery upon discharge.  Ryan Horne motivation was an integral factor for scheduling further treatment.  Employment, transportation, bed availability, health status, family support, and any pending legal issues were also considered during his hospital stay.  He was offered further treatment options upon discharge including but not limited to Residential, Intensive Outpatient, and Outpatient treatment.  FORSTER PAMER will follow up with the services as listed below under Follow Up Information.     Upon completion of this admission the Ryan Horne was both mentally and medically stable for discharge denying suicidal/homicidal ideation, auditory/visual/tactile hallucinations, delusional thoughts and paranoia.      Physical Findings: AIMS:  , ,  ,  ,    CIWA:    COWS:     Musculoskeletal: Strength & Muscle Tone: within normal limits Gait & Station: normal Patient leans: N/A   Psychiatric Specialty Exam:  Presentation  General Appearance:  Casual; Appropriate for Environment  Eye  Contact: Fair  Speech: Clear and Coherent  Speech Volume: Normal  Handedness: Right   Mood and Affect  Mood: Euthymic  Affect: Appropriate   Thought Process  Thought Processes: Goal Directed  Descriptions of Associations:Intact  Orientation:Full (Time, Place and Person)  Thought Content:Logical  History of Schizophrenia/Schizoaffective disorder:Yes  Duration of Psychotic Symptoms:Greater than six months  Hallucinations:Hallucinations: None  Ideas of Reference:None  Suicidal Thoughts:Suicidal Thoughts: No  Homicidal Thoughts:Homicidal Thoughts: No   Sensorium  Memory: Immediate Fair; Recent Fair  Judgment: Fair  Insight: Fair   Community education officer  Concentration: Fair  Attention Span: Fair  Recall: [employer] of Knowledge: Fair  Language: Fair   Psychomotor Activity  Psychomotor Activity: Psychomotor Activity: Normal   Assets  Assets: Armed forces logistics/support/administrative officer; Housing; Physical Health; Resilience; Social Support   Sleep  Sleep: Sleep: Good    Physical Exam: Physical Exam Vitals and nursing note reviewed.  Constitutional:      Appearance: He is normal weight. He is not ill-appearing.  Neurological:     Mental Status: He is alert and oriented to person, place, and time.  Psychiatric:        Attention and Perception: He  does not perceive auditory or visual hallucinations.        Mood and Affect: Mood normal.        Speech: Speech normal.        Behavior: Behavior is cooperative.        Thought Content: Thought content is not paranoid or delusional. Thought content does not include homicidal or suicidal ideation. Thought content does not include homicidal or suicidal plan.    Review of Systems  Psychiatric/Behavioral:  Negative for depression and suicidal ideas.    Blood pressure 118/68, pulse 80, temperature 97.8 F (36.6 C), temperature source Oral, resp. rate 16, height 5\' 11"  (1.803 m), weight 82.6 kg, SpO2 99 %.  Body mass index is 25.38 kg/m.   Social History   Tobacco Use  Smoking Status Some Days   Types: Cigarettes  Smokeless Tobacco Never   Tobacco Cessation:  A prescription for an FDA-approved tobacco cessation medication was offered at discharge and the patient refused   Blood Alcohol level:  Lab Results  Component Value Date   ETH <10 [id number]    Metabolic Disorder Labs:  Lab Results  Component Value Date   HGBA1C 5.1 07/27/2022   MPG 100 07/27/2022   No results found for: "PROLACTIN" Lab Results  Component Value Date   CHOL 155 07/27/2022   TRIG 91 07/27/2022   HDL 41 07/27/2022   CHOLHDL 3.8 07/27/2022   VLDL 18 07/27/2022   [place] 96 07/27/2022    See Psychiatric Specialty Exam and Suicide Risk Assessment completed by Attending Physician prior to discharge.  Discharge destination:  Home  Is patient on multiple antipsychotic therapies at discharge:  No   Has Patient had three or more failed trials of antipsychotic monotherapy by history:  No  Recommended Plan for Multiple Antipsychotic Therapies: NA  Discharge Instructions     Diet - low sodium heart healthy   Complete by: As directed    Increase activity slowly   Complete by: As directed       Allergies as of 08/03/2022       Reactions   Amoxicillin Rash        Medication List     STOP taking these medications    acetaminophen 325 MG tablet Commonly known as: TYLENOL   alum & mag hydroxide-simeth [id number] MG/5ML suspension Commonly known as: MAALOX/MYLANTA   LORazepam 1 MG tablet Commonly known as: ATIVAN   magnesium hydroxide 400 MG/5ML suspension Commonly known as: MILK OF MAGNESIA   OLANZapine zydis 5 MG disintegrating tablet Commonly known as: ZYPREXA   ziprasidone 20 MG injection Commonly known as: Geodon       TAKE these medications      Indication  ARIPiprazole 10 MG tablet Commonly known as: ABILIFY Take 1 tablet (10 mg total) by mouth daily. Start taking  on: August 04, 2022  Indication: psychosis   FLUoxetine 20 MG capsule Commonly known as: PROZAC Take 1 capsule (20 mg total) by mouth daily. Start taking on: August 04, 2022  Indication: Depression   hydrOXYzine 25 MG tablet Commonly known as: ATARAX Take 1 tablet (25 mg total) by mouth every 6 (six) hours as needed for anxiety.  Indication: Feeling Anxious   propranolol 10 MG tablet Commonly known as: INDERAL Take 1 tablet (10 mg total) by mouth 2 (two) times daily.  Indication: tachycardia/anxiety   traZODone 100 MG tablet Commonly known as: DESYREL Take 1 tablet (100 mg total) by mouth at bedtime.  Indication: Trouble Sleeping  Follow-up Information     [facility] [place]. Go on 08/13/2022.   Why: You have a hospital follow up appointment for therapy and medication management services on 08/13/22 at 12:30 pm.  The appointment will be held in person. Contact information: [address] [place] [address] [phone]                 Follow-up recommendations:   -Follow-up with your outpatient psychiatric provider -instructions on appointment date, time, and address (location) are provided to you in discharge paperwork.   -Take your psychiatric medications as prescribed at discharge - instructions are provided to you in the discharge paperwork.    -Follow-up with outpatient primary care doctor and other specialists -for management of preventative medicine and chronic medical disease, including:   -Testing: Follow-up with outpatient provider for abnormal lab results:  none   -Recommend abstinence from alcohol, tobacco, and other illicit drug use at discharge.    -If your psychiatric symptoms recur, worsen, or if you have side effects to your psychiatric medications, call your outpatient psychiatric provider, [phone] or go to the nearest emergency department.   -If suicidal thoughts recur, call your outpatient psychiatric provider, 911, [phone]  or go to the nearest emergency department.  Comments:    Signed: Inda Merlin, NP 08/03/2022, 10:10 AM

## 2024-06-06 ENCOUNTER — Other Ambulatory Visit: Payer: Self-pay

## 2024-06-06 ENCOUNTER — Emergency Department (HOSPITAL_BASED_OUTPATIENT_CLINIC_OR_DEPARTMENT_OTHER): Payer: MEDICAID | Admitting: Radiology

## 2024-06-06 ENCOUNTER — Encounter (HOSPITAL_BASED_OUTPATIENT_CLINIC_OR_DEPARTMENT_OTHER): Payer: Self-pay | Admitting: Emergency Medicine

## 2024-06-06 ENCOUNTER — Ambulatory Visit (HOSPITAL_COMMUNITY)
Admission: EM | Admit: 2024-06-06 | Discharge: 2024-06-06 | Discharge status: Against Medical Advice | Payer: MEDICAID | Attending: Mental Health | Admitting: Mental Health

## 2024-06-06 ENCOUNTER — Emergency Department (HOSPITAL_BASED_OUTPATIENT_CLINIC_OR_DEPARTMENT_OTHER)
Admission: EM | Admit: 2024-06-06 | Discharge: 2024-06-06 | Disposition: A | Discharge status: Home / Self Care | Payer: MEDICAID | Attending: Emergency Medicine | Admitting: Emergency Medicine

## 2024-06-06 DIAGNOSIS — Y92410 Unspecified street and highway as the place of occurrence of the external cause: Secondary | ICD-10-CM | POA: Insufficient documentation

## 2024-06-06 DIAGNOSIS — Z23 Encounter for immunization: Secondary | ICD-10-CM | POA: Insufficient documentation

## 2024-06-06 DIAGNOSIS — T148XXA Other injury of unspecified body region, initial encounter: Secondary | ICD-10-CM

## 2024-06-06 DIAGNOSIS — Z5321 Procedure and treatment not carried out due to patient leaving prior to being seen by health care provider: Secondary | ICD-10-CM | POA: Insufficient documentation

## 2024-06-06 DIAGNOSIS — M25522 Pain in left elbow: Secondary | ICD-10-CM | POA: Diagnosis not present

## 2024-06-06 DIAGNOSIS — S4992XA Unspecified injury of left shoulder and upper arm, initial encounter: Secondary | ICD-10-CM | POA: Diagnosis present

## 2024-06-06 DIAGNOSIS — S40212A Abrasion of left shoulder, initial encounter: Secondary | ICD-10-CM | POA: Diagnosis not present

## 2024-06-06 DIAGNOSIS — S52125A Nondisplaced fracture of head of left radius, initial encounter for closed fracture: Secondary | ICD-10-CM | POA: Diagnosis not present

## 2024-06-06 MED ORDER — TETANUS-DIPHTH-ACELL PERTUSSIS 5-2-15.5 LF-MCG/0.5 IM SUSP
0.5000 mL | Freq: Once | INTRAMUSCULAR | Status: AC
  Administered 2024-06-06: 0.5 mL via INTRAMUSCULAR
  Filled 2024-06-06: qty 0.5

## 2024-06-06 MED ORDER — OXYCODONE-ACETAMINOPHEN 5-325 MG PO TABS: 1.0000 | ORAL_TABLET | Freq: Four times a day (QID) | ORAL | 0 refills | Status: AC | PRN

## 2024-06-06 MED ORDER — OXYCODONE-ACETAMINOPHEN 5-325 MG PO TABS
1.0000 | ORAL_TABLET | Freq: Once | ORAL | Status: AC
  Administered 2024-06-06: 1 via ORAL
  Filled 2024-06-06: qty 1
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
## 2024-06-06 NOTE — Discharge Instructions (Addendum)
 You are seen today for an injury to the left arm.  It does appear that you may have a fracture of your radial head.  Keep splint in place.  Follow-up with orthopedics.  Take pain medications as prescribed.  Do not drive while taking pain medication.

## 2024-06-06 NOTE — ED Notes (Signed)
 Patient left without being seen.
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
